# Patient Record
Sex: Female | Born: 1985 | Race: Black or African American | Hispanic: No | Marital: Single | Smoking: Never smoker
Health system: Southern US, Community
[De-identification: ages and names within clinical notes are randomized; demographics above are authoritative.]

## PROBLEM LIST (undated history)

## (undated) DIAGNOSIS — T7840XA Allergy, unspecified, initial encounter: Secondary | ICD-10-CM

## (undated) DIAGNOSIS — D649 Anemia, unspecified: Secondary | ICD-10-CM

## (undated) HISTORY — DX: Anemia, unspecified: D64.9

## (undated) HISTORY — PX: WISDOM TOOTH EXTRACTION: SHX21

## (undated) HISTORY — DX: Allergy, unspecified, initial encounter: T78.40XA

---

## 2004-05-05 ENCOUNTER — Emergency Department: Payer: Self-pay | Admitting: Emergency Medicine

## 2005-11-05 ENCOUNTER — Emergency Department: Payer: Self-pay | Admitting: Emergency Medicine

## 2006-05-15 ENCOUNTER — Emergency Department: Payer: Self-pay | Admitting: Internal Medicine

## 2008-02-28 ENCOUNTER — Emergency Department: Payer: Self-pay | Admitting: Emergency Medicine

## 2013-03-25 ENCOUNTER — Emergency Department: Payer: Self-pay | Admitting: Emergency Medicine

## 2013-11-26 ENCOUNTER — Emergency Department: Payer: Self-pay | Admitting: Emergency Medicine

## 2013-11-26 LAB — CBC
HCT: 37.9 % (ref 35.0–47.0)
HGB: 12.3 g/dL (ref 12.0–16.0)
MCH: 28.3 pg (ref 26.0–34.0)
MCHC: 32.3 g/dL (ref 32.0–36.0)
MCV: 88 fL (ref 80–100)
Platelet: 344 10*3/uL (ref 150–440)
RBC: 4.33 10*6/uL (ref 3.80–5.20)
RDW: 15.4 % — ABNORMAL HIGH (ref 11.5–14.5)
WBC: 10.1 10*3/uL (ref 3.6–11.0)

## 2013-11-26 LAB — WET PREP, GENITAL

## 2013-11-26 LAB — COMPREHENSIVE METABOLIC PANEL
ALBUMIN: 3 g/dL — AB (ref 3.4–5.0)
Alkaline Phosphatase: 91 U/L
Anion Gap: 7 (ref 7–16)
BILIRUBIN TOTAL: 0.3 mg/dL (ref 0.2–1.0)
BUN: 10 mg/dL (ref 7–18)
CHLORIDE: 106 mmol/L (ref 98–107)
Calcium, Total: 8.9 mg/dL (ref 8.5–10.1)
Co2: 24 mmol/L (ref 21–32)
Creatinine: 0.69 mg/dL (ref 0.60–1.30)
EGFR (Non-African Amer.): >60
GLUCOSE: 89 mg/dL (ref 65–99)
Osmolality: 272 (ref 275–301)
POTASSIUM: 3.6 mmol/L (ref 3.5–5.1)
SGOT(AST): 17 U/L (ref 15–37)
SGPT (ALT): 17 U/L
Sodium: 137 mmol/L (ref 136–145)
Total Protein: 9 g/dL — ABNORMAL HIGH (ref 6.4–8.2)

## 2013-11-26 LAB — LIPASE, BLOOD: Lipase: 129 U/L (ref 73–393)

## 2013-11-26 LAB — URINALYSIS, COMPLETE
BACTERIA: NONE SEEN
Bilirubin,UR: NEGATIVE
Blood: NEGATIVE
GLUCOSE, UR: NEGATIVE mg/dL (ref 0–75)
KETONE: NEGATIVE
Leukocyte Esterase: NEGATIVE
Nitrite: NEGATIVE
PH: 7 (ref 4.5–8.0)
Protein: NEGATIVE
RBC,UR: NONE SEEN /HPF (ref 0–5)
SPECIFIC GRAVITY: 1.012 (ref 1.003–1.030)
WBC UR: NONE SEEN /HPF (ref 0–5)

## 2013-11-26 LAB — HCG, QUANTITATIVE, PREGNANCY: Beta Hcg, Quant.: <1 m[IU]/mL — ABNORMAL LOW

## 2013-11-26 LAB — GC/CHLAMYDIA PROBE AMP

## 2015-10-10 ENCOUNTER — Emergency Department
Admission: EM | Admit: 2015-10-10 | Discharge: 2015-10-10 | Disposition: A | Discharge status: Home / Self Care | Payer: Self-pay | Attending: Emergency Medicine | Admitting: Emergency Medicine

## 2015-10-10 ENCOUNTER — Encounter: Payer: Self-pay | Admitting: Emergency Medicine

## 2015-10-10 DIAGNOSIS — J020 Streptococcal pharyngitis: Secondary | ICD-10-CM | POA: Insufficient documentation

## 2015-10-10 LAB — POCT RAPID STREP A: Streptococcus, Group A Screen (Direct): POSITIVE — AB

## 2015-10-10 MED ORDER — ACETAMINOPHEN-CODEINE #3 300-30 MG PO TABS
1.0000 | ORAL_TABLET | Freq: Once | ORAL | Status: AC
  Administered 2015-10-10: 1 via ORAL
  Filled 2015-10-10: qty 1

## 2015-10-10 MED ORDER — AZITHROMYCIN 500 MG PO TABS
500.0000 mg | ORAL_TABLET | Freq: Once | ORAL | Status: AC
  Administered 2015-10-10: 500 mg via ORAL
  Filled 2015-10-10: qty 1

## 2015-10-10 MED ORDER — AZITHROMYCIN 250 MG PO TABS: 250.0000 mg | ORAL_TABLET | Freq: Every day | ORAL | Status: AC

## 2015-10-10 MED ORDER — DEXAMETHASONE SODIUM PHOSPHATE 10 MG/ML IJ SOLN
10.0000 mg | Freq: Once | INTRAMUSCULAR | Status: AC
  Administered 2015-10-10: 10 mg via INTRAMUSCULAR
  Filled 2015-10-10: qty 1

## 2015-10-10 MED ORDER — ACETAMINOPHEN 325 MG PO TABS
ORAL_TABLET | ORAL | Status: DC
Start: 2015-10-10 — End: 2015-10-11
  Filled 2015-10-10: qty 2

## 2015-10-10 MED ORDER — ACETAMINOPHEN 325 MG PO TABS
650.0000 mg | ORAL_TABLET | Freq: Once | ORAL | Status: AC | PRN
  Administered 2015-10-10: 650 mg via ORAL

## 2015-10-10 MED ORDER — ACETAMINOPHEN-CODEINE #3 300-30 MG PO TABS: 1.0000 | ORAL_TABLET | Freq: Four times a day (QID) | ORAL | Status: DC | PRN

## 2015-10-10 NOTE — ED Notes (Signed)
Rapid Strep, Positive

## 2015-10-10 NOTE — ED Notes (Signed)
Patient woke up yesterday morning with fever and sore throat.

## 2015-10-10 NOTE — Discharge Instructions (Signed)
Strep Throat °Strep throat is an infection of the throat. It is caused by germs. Strep throat spreads from person to person because of coughing, sneezing, or close contact. °HOME CARE °Medicines  °· Take over-the-counter and prescription medicines only as told by your doctor. °· Take your antibiotic medicine as told by your doctor. Do not stop taking the medicine even if you feel better. °· Have family members who also have a sore throat or fever go to a doctor. °Eating and Drinking  °· Do not share food, drinking cups, or personal items. °· Try eating soft foods until your sore throat feels better. °· Drink enough fluid to keep your pee (urine) clear or pale yellow. °General Instructions °· Rinse your mouth (gargle) with a salt-water mixture 3-4 times per day or as needed. To make a salt-water mixture, stir ½-1 tsp of salt into 1 cup of warm water. °· Make sure that all people in your house wash their hands well. °· Rest. °· Stay home from school or work until you have been taking antibiotics for 24 hours. °· Keep all follow-up visits as told by your doctor. This is important. °GET HELP IF: °· Your neck keeps getting bigger. °· You get a rash, cough, or earache. °· You cough up thick liquid that is green, yellow-brown, or bloody. °· You have pain that does not get better with medicine. °· Your problems get worse instead of getting better. °· You have a fever. °GET HELP RIGHT AWAY IF: °· You throw up (vomit). °· You get a very bad headache. °· You neck hurts or it feels stiff. °· You have chest pain or you are short of breath. °· You have drooling, very bad throat pain, or changes in your voice. °· Your neck is swollen or the skin gets red and tender. °· Your mouth is dry or you are peeing less than normal. °· You keep feeling more tired or it is hard to wake up. °· Your joints are red or they hurt. °  °This information is not intended to replace advice given to you by your health care provider. Make sure you  discuss any questions you have with your health care provider. °  °Document Released: 10/04/2007 Document Revised: 01/06/2015 Document Reviewed: 08/10/2014 °Elsevier Interactive Patient Education ©2016 [employer] ° °

## 2015-10-11 NOTE — ED Provider Notes (Signed)
[facility] Emergency Department Provider Note ____________________________________________  Time seen: 2224  I have reviewed the triage vital signs and the nursing notes.  HISTORY  Chief Complaint  Sore Throat and Fever  HPI Anita Hodge is a 30 y.o. female presents to the ED for evaluation of sudden onset of sore throat and fevers 2 days prior. The patient denies any sick contacts or recent travel. She does report a history of previous strep infections. She has dosed Tylenol with limited benefit.She rates her pain as a 9/10 in triage.  History reviewed. No pertinent past medical history.  There are no active problems to display for this patient.  History reviewed. No pertinent past surgical history.  Current Outpatient Rx  Name  Route  Sig  Dispense  Refill  . acetaminophen-codeine (TYLENOL #3) 300-30 MG tablet   Oral   Take 1 tablet by mouth every 6 (six) hours as needed for moderate pain.   10 tablet   0   . azithromycin (ZITHROMAX) 250 MG tablet   Oral   Take 1 tablet (250 mg total) by mouth daily.   4 each   0    Allergies Penicillins  No family history on file.  Social History Social History  Substance Use Topics  . Smoking status: Never Smoker   . Smokeless tobacco: None  . Alcohol Use: Yes     Comment: occasionally   Review of Systems  Constitutional: Positive for fever. Eyes: Negative for visual changes. ENT: Positive for sore throat. Gastrointestinal: Negative for abdominal pain, vomiting and diarrhea. Skin: Negative for rash. Neurological: Negative for headaches, focal weakness or numbness. ____________________________________________  PHYSICAL EXAM:  VITAL SIGNS: ED Triage Vitals  Enc Vitals Group     BP 10/10/15 2109 111/54 mmHg     Pulse Rate 10/10/15 2109 120     Resp 10/10/15 2109 18     Temp 10/10/15 2109 101.3 F (38.5 C)     Temp Source 10/10/15 2109 Oral     SpO2 10/10/15 2109 96 %     Weight 10/10/15  2109 300 lb (136.079 kg)     Height 10/10/15 2109  (1.753 m)     Head Cir --      Peak Flow --      Pain Score 10/10/15 2110 9     Pain Loc --      Pain Edu? --      Excl. in GC? --    Constitutional: Alert and oriented. Well appearing and in no distress. Head: Normocephalic and atraumatic. Eyes: Conjunctivae are normal. PERRL. Normal extraocular movements Nose: No congestion/rhinorrhea. Mouth/Throat: Mucous membranes are moist. Uvula is midline and tonsils are enlarged, erythematous, and with purulent exudate. Neck: Supple. No thyromegaly. Hematological/Lymphatic/Immunological: Palpable cervical lymphadenopathy. Cardiovascular: Normal rate, regular rhythm.  Respiratory: Normal respiratory effort. No wheezes/rales/rhonchi. Skin:  Skin is warm, dry and intact. No rash noted. Psychiatric: Mood and affect are normal. Patient exhibits appropriate insight and judgment. ____________________________________________   LABS (pertinent positives/negatives) Labs Reviewed  POCT RAPID STREP A - Abnormal; Notable for the following:    Streptococcus, Group A Screen (Direct) POSITIVE (*)    All other components within normal limits  ____________________________________________  PROCEDURES  Tylenol 650 mg PO Decadron 10 mg PO Azithromycin 500 mg PO Tylenol #3 i PO ____________________________________________  INITIAL IMPRESSION / ASSESSMENT AND PLAN / ED COURSE  Patient with an extra acute strep tonsillitis confirmed by rapid strep test screen. She will be discharged with  a prescription for the balance of her azithromycin as well as prescription for Tylenol with Codeine to dose as needed for pain. She will follow-up with her primary care provider for ongoing symptom management. Return precautions are reviewed. ____________________________________________  FINAL CLINICAL IMPRESSION(S) / ED DIAGNOSES  Final diagnoses:  Strep throat     Lissa HoardJenise V Bacon Da Authement, PA-C 10/11/15  0015  Sharyn CreamerMark Quale, MD 10/12/15 0009

## 2017-05-26 ENCOUNTER — Emergency Department
Admission: EM | Admit: 2017-05-26 | Discharge: 2017-05-26 | Disposition: A | Discharge status: Home / Self Care | Payer: Self-pay | Attending: Emergency Medicine | Admitting: Emergency Medicine

## 2017-05-26 ENCOUNTER — Other Ambulatory Visit: Payer: Self-pay

## 2017-05-26 DIAGNOSIS — M5432 Sciatica, left side: Secondary | ICD-10-CM | POA: Insufficient documentation

## 2017-05-26 MED ORDER — OXYCODONE HCL 5 MG PO TABS
10.0000 mg | ORAL_TABLET | Freq: Once | ORAL | Status: AC
  Administered 2017-05-26: 10 mg via ORAL
  Filled 2017-05-26: qty 2

## 2017-05-26 MED ORDER — PREDNISONE 20 MG PO TABS
60.0000 mg | ORAL_TABLET | Freq: Once | ORAL | Status: AC
  Administered 2017-05-26: 60 mg via ORAL
  Filled 2017-05-26: qty 3

## 2017-05-26 MED ORDER — CYCLOBENZAPRINE HCL 10 MG PO TABS: 10.0000 mg | ORAL_TABLET | Freq: Three times a day (TID) | ORAL | 0 refills | Status: DC | PRN

## 2017-05-26 MED ORDER — HYDROCODONE-ACETAMINOPHEN 5-325 MG PO TABS: 1.0000 | ORAL_TABLET | ORAL | 0 refills | Status: AC | PRN

## 2017-05-26 MED ORDER — PREDNISONE 10 MG (21) PO TBPK: ORAL_TABLET | ORAL | 0 refills | Status: DC

## 2017-05-26 NOTE — ED Provider Notes (Signed)
[facility] Emergency Department Provider Note ____________________________________________  Time seen: Approximately 10:21 PM  I have reviewed the triage vital signs and the nursing notes.  HISTORY  Chief Complaint Back Pain   HPI Anita Hodge is a 32 y.o. female who presents to the emergency department for treatment and evaluation of left lower back pain that radiates down the left leg to the ankle.  Patient states that she has had intermittent back pain on and off for several months and it is never been this bad.  She states that she has been able to take some ibuprofen then take a hot shower and rest which has helped in the past but for the past day she has had no relief with any of the above.  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  She denies any injury.  She denies dysuria or hematuria. No past medical history on file.  There are no active problems to display for this patient.   No past surgical history on file.  Prior to Admission medications   Medication Sig Start Date End Date Taking? Authorizing Provider  acetaminophen-codeine (TYLENOL #3) 300-30 MG tablet Take 1 tablet by mouth every 6 (six) hours as needed for moderate pain. 10/10/15   Menshew, Charlesetta Ivory, PA-C  cyclobenzaprine (FLEXERIL) 10 MG tablet Take 1 tablet (10 mg total) by mouth 3 (three) times daily as needed for muscle spasms. 05/26/17   Khali Albanese, Rulon Eisenmenger B, FNP  HYDROcodone-acetaminophen (NORCO/VICODIN) 5-325 MG tablet Take 1 tablet by mouth every 4 (four) hours as needed for moderate pain. 05/26/17 05/26/18  Boyd Litaker, Kasandra Knudsen, FNP  predniSONE (STERAPRED UNI-PAK 21 TAB) 10 MG (21) TBPK tablet Take 6 tablets on day 1 Take 5 tablets on day 2 Take 4 tablets on day 3 Take 3 tablets on day 4 Take 2 tablets on day 5 Take 1 tablet on day 6 05/26/17   Kem Boroughs B, FNP    Allergies Penicillins  No family history on  file.  Social History Social History   Tobacco Use  . Smoking status: Never Smoker  Substance Use Topics  . Alcohol use: Yes    Comment: occasionally  . Drug use: No    Review of Systems Constitutional: Well appearing. Respiratory: Negative for dyspnea. Cardiovascular: Negative for change in skin temperature or color. Musculoskeletal:   Negative for chronic steroid use   Negative for trauma in the presence of osteoporosis  Negative for age over 52 and trauma.  Negative for constitutional symptoms, or history of cancer   Negative for pain worse at night. Skin: Negative for rash, lesion, or wound.  Genitourinary: Negative for urinary retention. Rectal: Negative for fecal incontinence or new onset constipation/bowel habit changes. Hematological/Immunilogical: Negative for immunosuppression, IV drug use, or fever Neurological: Positive for burning, tingling, numb, electric, radiating pain in the left lower extremity                        Negative for saddle anesthesia.                        Negative for focal neurologic deficit, progressive or disabling symptoms             Negative for saddle anesthesia. ____________________________________________   PHYSICAL EXAM:  VITAL SIGNS: ED Triage Vitals [05/26/17 2130]  Enc Vitals Group  BP 121/73     Pulse Rate 73     Resp 16     Temp 98.3 F (36.8 C)     Temp Source Oral     SpO2 100 %     Weight (!) 320 lb (145.2 kg)     Height 5\' 10"  (1.778 m)     Head Circumference      Peak Flow      Pain Score 9     Pain Loc      Pain Edu?      Excl. in GC?     Constitutional: Alert and oriented. Well appearing and in no acute distress. Eyes: Conjunctivae are clear without discharge or drainage.  Head: Atraumatic. Neck: Full, active range of motion. Respiratory: Respirations even and unlabored. Musculoskeletal: Limited ROM of the back and left lower extremity secondary to pain, Strength 5/5 of the lower extremities as  tested. Neurologic: Reflexes of the lower extremities are 2+.  Positive straight leg raise on the left side. Skin: Atraumatic.  Psychiatric: Behavior and affect are normal.  ____________________________________________   LABS (all labs ordered are listed, but only abnormal results are displayed)  Labs Reviewed - No data to display ____________________________________________  RADIOLOGY  Not indicated ____________________________________________   PROCEDURES  Procedure(s) performed:  Procedures ____________________________________________   INITIAL IMPRESSION / ASSESSMENT AND PLAN / ED COURSE  Murtis Sinkia V Ortlieb is a 32 y.o. female who presents to the emergency department for evaluation treatment of symptoms and exam most consistent with sciatica.  She was instructed to take her medications as prescribed and to follow up with PCP or orthopedics if not improving over the week. She was advised to return to the emergency department for symptoms that change or worsen if unable to schedule an appointment.  Medications  oxyCODONE (Oxy IR/ROXICODONE) immediate release tablet 10 mg (not administered)  predniSONE (DELTASONE) tablet 60 mg (not administered)    ED Discharge Orders        Ordered    HYDROcodone-acetaminophen (NORCO/VICODIN) 5-325 MG tablet  Every 4 hours PRN     05/26/17 2211    predniSONE (STERAPRED UNI-PAK 21 TAB) 10 MG (21) TBPK tablet     05/26/17 2211    cyclobenzaprine (FLEXERIL) 10 MG tablet  3 times daily PRN     05/26/17 2211       Pertinent labs & imaging results that were available during my care of the patient were reviewed by me and considered in my medical decision making (see chart for details).  _________________________________________   FINAL CLINICAL IMPRESSION(S) / ED DIAGNOSES  Final diagnoses:  Sciatica of left side     If controlled substance prescribed during this visit, 12 month history viewed on the [facility] prior to issuing an  initial prescription for Schedule II or III opiod.    Anita Pesterriplett, Leyna Vanderkolk B, FNP 05/26/17 2228    Sharman CheekStafford, Phillip, MD 05/27/17 2329

## 2017-05-26 NOTE — ED Notes (Signed)
NAD noted at time of D/C. Pt denies questions or concerns. Pt taken to the lobby via wheelchair at this time.  

## 2017-05-26 NOTE — ED Triage Notes (Signed)
Pt states low back pain that radiates down left lower leg to ankle. Pt states pain is worse with sitting, ambulation.

## 2018-10-30 ENCOUNTER — Ambulatory Visit (LOCAL_COMMUNITY_HEALTH_CENTER): Payer: Medicaid Other | Admitting: Physician Assistant

## 2018-10-30 ENCOUNTER — Other Ambulatory Visit: Payer: Self-pay

## 2018-10-30 VITALS — BP 118/72

## 2018-10-30 DIAGNOSIS — Z3009 Encounter for other general counseling and advice on contraception: Secondary | ICD-10-CM

## 2018-10-30 DIAGNOSIS — Z30013 Encounter for initial prescription of injectable contraceptive: Secondary | ICD-10-CM | POA: Diagnosis not present

## 2018-10-30 MED ORDER — MEDROXYPROGESTERONE ACETATE 150 MG/ML IM SUSP
150.0000 mg | Freq: Once | INTRAMUSCULAR | Status: AC
  Administered 2018-10-30: 12:00:00 150 mg via INTRAMUSCULAR

## 2018-10-30 NOTE — Progress Notes (Signed)
Family Planning Visit- Repeat Yearly Visit  Subjective:  Anita Hodge is a 33 y.o. being seen today  to discuss family planning options.    She is currently using condoms most of the time for pregnancy prevention. Patient reports she does not  Want a pregnancy in the next year. Patient has the following medical conditionsdoes not have a problem list on file.  Chief Complaint  Patient presents with  . Contraception    Patient reports that she has been on Depo in the past without any problem.  Last Depo was prior to last pregnancy, Delivered 08/23/2016.  Last pap was 06/2017 and was normal.  LMP 10/04/2018 and was normal.  Last sex was 10/29/2018 with a condom.  Has been using condoms always and preformed a OTC pregnancy test at home on 10/26/2018 which was negative.  Declines repeat pregnancy test today.  Patient denies any concerns today.  Does the patient desire a pregnancy in the next year? (OKQ flowsheet)  See flowsheet for other program required questions.   There is no height or weight on file to calculate BMI. - Patient is eligible for diabetes screening based on BMI and age >67?  not applicable EL3Y ordered? not applicable  Patient reports 1 of partners in last year. Desires STI screening?  No - .  Does the patient have a current or past history of drug use? No   No components found for: HCV]   Health Maintenance Due  Topic Date Due  . HIV Screening  12/09/1999  . TETANUS/TDAP  12/09/2003  . PAP SMEAR-Modifier  12/08/2005    Review of Systems  All other systems reviewed and are negative.   The following portions of the patient's history were reviewed and updated as appropriate: allergies, current medications, past family history, past medical history, past social history, past surgical history and problem list. Problem list updated.  Objective:   Vitals:   10/30/18 1100  BP: 118/72    Physical Exam Constitutional:      Appearance: She is obese.  Neurological:    Mental Status: She is alert and oriented to person, place, and time.  Psychiatric:        Mood and Affect: Mood normal.    Due to COVID-19, we are not doing any physical exams at this time.   Assessment and Plan:  Anita Hodge is a 33 y.o. female presenting to the [facility] Department for a family planning visit  Contraception counseling: Reviewed all forms of birth control options available including abstinence; over the counter/barrier methods; hormonal contraceptive medication including pill, patch, ring, injection,contraceptive implant; hormonal and nonhormonal IUDs; permanent sterilization options including vasectomy and the various tubal sterilization modalities. Risks and benefits reviewed.  Questions were answered.  Written information was also given to the patient to review.  Patient desires Depo, this was prescribed for patient. She will follow up in  3 months for surveillance.  She was told to call with any further questions, or with any concerns about this method of contraception.  Emphasized use of condoms 100% of the time for STI prevention.  1. Contraceptive education Reviewed with patient all BCMs; risks, benefits, and SE. Rec condoms with all sex for STD protection and especially for 2 weeks after shot today. RTC for RP appt in ~12 weeks if we are doing PE at that time or for Depo only if not. - medroxyPROGESTERone (DEPO-PROVERA) injection 150 mg  2. Initiation of Depo Provera OK for Depo 150mg  IM  q 11-13 weeks x 2, starting today.  Per patient request, please give in hip today Counseled patient when to call for irregular bleeding  - medroxyPROGESTERone (DEPO-PROVERA) injection 150 mg     No follow-ups on file.  No future appointments.  Matt Holmesarla J Ramina Hulet, PA

## 2018-10-31 ENCOUNTER — Encounter: Payer: Self-pay | Admitting: Physician Assistant

## 2020-08-04 ENCOUNTER — Ambulatory Visit: Payer: Medicaid Other

## 2021-06-14 ENCOUNTER — Emergency Department: Payer: Self-pay

## 2021-06-14 ENCOUNTER — Observation Stay
Admission: EM | Admit: 2021-06-14 | Discharge: 2021-06-15 | Disposition: A | Discharge status: Home / Self Care | Payer: Self-pay | Attending: Internal Medicine | Admitting: Internal Medicine

## 2021-06-14 ENCOUNTER — Encounter: Payer: Self-pay | Admitting: Emergency Medicine

## 2021-06-14 ENCOUNTER — Observation Stay: Payer: Self-pay

## 2021-06-14 DIAGNOSIS — G43809 Other migraine, not intractable, without status migrainosus: Secondary | ICD-10-CM

## 2021-06-14 DIAGNOSIS — M7521 Bicipital tendinitis, right shoulder: Principal | ICD-10-CM | POA: Insufficient documentation

## 2021-06-14 DIAGNOSIS — M25511 Pain in right shoulder: Secondary | ICD-10-CM

## 2021-06-14 DIAGNOSIS — M542 Cervicalgia: Secondary | ICD-10-CM

## 2021-06-14 DIAGNOSIS — M778 Other enthesopathies, not elsewhere classified: Secondary | ICD-10-CM | POA: Insufficient documentation

## 2021-06-14 DIAGNOSIS — G43909 Migraine, unspecified, not intractable, without status migrainosus: Secondary | ICD-10-CM

## 2021-06-14 DIAGNOSIS — D649 Anemia, unspecified: Secondary | ICD-10-CM | POA: Insufficient documentation

## 2021-06-14 DIAGNOSIS — G459 Transient cerebral ischemic attack, unspecified: Secondary | ICD-10-CM | POA: Insufficient documentation

## 2021-06-14 DIAGNOSIS — R200 Anesthesia of skin: Secondary | ICD-10-CM

## 2021-06-14 DIAGNOSIS — U071 COVID-19: Secondary | ICD-10-CM | POA: Insufficient documentation

## 2021-06-14 DIAGNOSIS — Z8669 Personal history of other diseases of the nervous system and sense organs: Secondary | ICD-10-CM

## 2021-06-14 LAB — LIPID PANEL
Cholesterol: 174 mg/dL (ref 0–200)
HDL: 45 mg/dL (ref >40)
LDL Cholesterol: 111 mg/dL — ABNORMAL HIGH (ref 0–99)
Total CHOL/HDL Ratio: 3.9 RATIO
Triglycerides: 90 mg/dL (ref <150)
VLDL: 18 mg/dL (ref 0–40)

## 2021-06-14 LAB — COMPREHENSIVE METABOLIC PANEL
ALT: 13 U/L (ref 0–44)
AST: 16 U/L (ref 15–41)
Albumin: 3.4 g/dL — ABNORMAL LOW (ref 3.5–5.0)
Alkaline Phosphatase: 67 U/L (ref 38–126)
Anion gap: 5 (ref 5–15)
BUN: 18 mg/dL (ref 6–20)
CO2: 27 mmol/L (ref 22–32)
Calcium: 8.9 mg/dL (ref 8.9–10.3)
Chloride: 104 mmol/L (ref 98–111)
Creatinine, Ser: 0.72 mg/dL (ref 0.44–1.00)
GFR, Estimated: >60 mL/min (ref >60)
Glucose, Bld: 92 mg/dL (ref 70–99)
Potassium: 3.7 mmol/L (ref 3.5–5.1)
Sodium: 136 mmol/L (ref 135–145)
Total Bilirubin: 0.3 mg/dL (ref 0.3–1.2)
Total Protein: 8.3 g/dL — ABNORMAL HIGH (ref 6.5–8.1)

## 2021-06-14 LAB — CBC
HCT: 31.3 % — ABNORMAL LOW (ref 36.0–46.0)
Hemoglobin: 9.3 g/dL — ABNORMAL LOW (ref 12.0–15.0)
MCH: 23.4 pg — ABNORMAL LOW (ref 26.0–34.0)
MCHC: 29.7 g/dL — ABNORMAL LOW (ref 30.0–36.0)
MCV: 78.8 fL — ABNORMAL LOW (ref 80.0–100.0)
Platelets: 368 10*3/uL (ref 150–400)
RBC: 3.97 MIL/uL (ref 3.87–5.11)
RDW: 17.7 % — ABNORMAL HIGH (ref 11.5–15.5)
WBC: 6.8 10*3/uL (ref 4.0–10.5)
nRBC: 0 % (ref 0.0–0.2)

## 2021-06-14 LAB — RESP PANEL BY RT-PCR (FLU A&B, COVID) ARPGX2
Influenza A by PCR: NEGATIVE
Influenza B by PCR: NEGATIVE
SARS Coronavirus 2 by RT PCR: POSITIVE — AB

## 2021-06-14 LAB — DIFFERENTIAL
Abs Immature Granulocytes: 0.01 10*3/uL (ref 0.00–0.07)
Basophils Absolute: 0 10*3/uL (ref 0.0–0.1)
Basophils Relative: 0 %
Eosinophils Absolute: 0.2 10*3/uL (ref 0.0–0.5)
Eosinophils Relative: 3 %
Immature Granulocytes: 0 %
Lymphocytes Relative: 34 %
Lymphs Abs: 2.3 10*3/uL (ref 0.7–4.0)
Monocytes Absolute: 1.1 10*3/uL — ABNORMAL HIGH (ref 0.1–1.0)
Monocytes Relative: 17 %
Neutro Abs: 3.2 10*3/uL (ref 1.7–7.7)
Neutrophils Relative %: 46 %

## 2021-06-14 LAB — HEMOGLOBIN A1C
Hgb A1c MFr Bld: 5.3 % (ref 4.8–5.6)
Mean Plasma Glucose: 105.41 mg/dL

## 2021-06-14 LAB — TROPONIN I (HIGH SENSITIVITY): Troponin I (High Sensitivity): 3 ng/L (ref <18)

## 2021-06-14 LAB — APTT: aPTT: 23 seconds — ABNORMAL LOW (ref 24–36)

## 2021-06-14 LAB — PROTIME-INR
INR: 1.1 (ref 0.8–1.2)
Prothrombin Time: 13.7 seconds (ref 11.4–15.2)

## 2021-06-14 MED ORDER — KETOROLAC TROMETHAMINE 30 MG/ML IJ SOLN
30.0000 mg | Freq: Once | INTRAMUSCULAR | Status: AC
  Administered 2021-06-14: 30 mg via INTRAVENOUS
  Filled 2021-06-14: qty 1

## 2021-06-14 MED ORDER — ACETAMINOPHEN 650 MG RE SUPP: 650.0000 mg | RECTAL | Status: DC | PRN

## 2021-06-14 MED ORDER — ONDANSETRON HCL 4 MG/2ML IJ SOLN: 4.0000 mg | INTRAMUSCULAR | Status: DC | PRN

## 2021-06-14 MED ORDER — ASPIRIN 81 MG PO CHEW
324.0000 mg | CHEWABLE_TABLET | Freq: Once | ORAL | Status: AC
  Administered 2021-06-14: 324 mg via ORAL
  Filled 2021-06-14: qty 4

## 2021-06-14 MED ORDER — SODIUM CHLORIDE 0.9% FLUSH: 3.0000 mL | Freq: Once | INTRAVENOUS | Status: DC

## 2021-06-14 MED ORDER — CLOPIDOGREL BISULFATE 75 MG PO TABS
75.0000 mg | ORAL_TABLET | Freq: Every day | ORAL | Status: DC
  Administered 2021-06-14 – 2021-06-15 (×2): 75 mg via ORAL
  Filled 2021-06-14 (×2): qty 1

## 2021-06-14 MED ORDER — DIPHENHYDRAMINE HCL 50 MG/ML IJ SOLN
25.0000 mg | Freq: Once | INTRAMUSCULAR | Status: AC
Start: 2021-06-14 — End: 2021-06-14
  Administered 2021-06-14: 25 mg via INTRAVENOUS
  Filled 2021-06-14: qty 1

## 2021-06-14 MED ORDER — ACETAMINOPHEN 325 MG PO TABS: 650.0000 mg | ORAL_TABLET | ORAL | Status: DC | PRN

## 2021-06-14 MED ORDER — ASPIRIN EC 81 MG PO TBEC
81.0000 mg | DELAYED_RELEASE_TABLET | Freq: Every day | ORAL | Status: DC
  Administered 2021-06-15: 10:00:00 81 mg via ORAL
  Filled 2021-06-14: qty 1

## 2021-06-14 MED ORDER — ACETAMINOPHEN 160 MG/5ML PO SOLN
650.0000 mg | ORAL | Status: DC | PRN
  Filled 2021-06-14: qty 20.3

## 2021-06-14 MED ORDER — SODIUM CHLORIDE 0.9 % IV SOLN: INTRAVENOUS | Status: DC

## 2021-06-14 MED ORDER — PROCHLORPERAZINE EDISYLATE 10 MG/2ML IJ SOLN
10.0000 mg | Freq: Once | INTRAMUSCULAR | Status: AC
  Administered 2021-06-14: 10 mg via INTRAVENOUS
  Filled 2021-06-14: qty 2

## 2021-06-14 MED ORDER — STROKE: EARLY STAGES OF RECOVERY BOOK
Freq: Once | Status: AC
  Administered 2021-06-15: 02:00:00 1

## 2021-06-14 MED ORDER — ENOXAPARIN SODIUM 80 MG/0.8ML IJ SOSY
0.5000 mg/kg | PREFILLED_SYRINGE | INTRAMUSCULAR | Status: DC
  Administered 2021-06-15: 02:00:00 72.5 mg via SUBCUTANEOUS
  Filled 2021-06-14 (×3): qty 0.72

## 2021-06-14 MED ORDER — MAGNESIUM HYDROXIDE 400 MG/5ML PO SUSP: 30.0000 mL | Freq: Every day | ORAL | Status: DC | PRN

## 2021-06-14 MED ORDER — SENNOSIDES-DOCUSATE SODIUM 8.6-50 MG PO TABS: 1.0000 | ORAL_TABLET | Freq: Every evening | ORAL | Status: DC | PRN

## 2021-06-14 MED ORDER — TRAZODONE HCL 50 MG PO TABS: 25.0000 mg | ORAL_TABLET | Freq: Every evening | ORAL | Status: DC | PRN

## 2021-06-14 NOTE — ED Notes (Addendum)
Pt transported to CT with neuro continued at bedside. Tele neuro activated

## 2021-06-14 NOTE — ED Triage Notes (Signed)
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 Pt reported right hand grip weakness and HA at 1835. On arrival symptoms still present. Neurologist at stretcher as well as CBG obtained by ED staff. No obvious facial droop, no slurred speech. Weakness noted with right handgrip and pt reports HA but denies vision changes. No previous medical hx. Pt taken from EMS stretcher to CT with RN and neurologist.   CBG on arrival 79  167/114 89 HR 97% O2 RA 20 RR

## 2021-06-14 NOTE — ED Provider Notes (Signed)
[facility] Provider Note    Event Date/Time   First MD Initiated Contact with Patient 06/14/21 1948     (approximate)   History   Extremity Weakness   HPI  Anita Hodge is a 36 y.o. female with no known medical problems who comes ED complaining of right hand weakness that started at 6:30 PM.  Also reports some right shoulder pain that radiates down the arm.  No trauma, no other areas of weakness or altered sensation.     Physical Exam   Triage Vital Signs: ED Triage Vitals  Enc Vitals Group     BP 06/14/21 1950 (!) 167/114     Pulse Rate 06/14/21 1950 89     Resp 06/14/21 1950 (!) 21     Temp 06/14/21 2033 98.4 F (36.9 C)     Temp Source 06/14/21 2033 Oral     SpO2 06/14/21 1950 97 %     Weight 06/14/21 2033 (!) 315 lb (142.9 kg)     Height 06/14/21 2033 5\' 9"  (1.753 m)     Head Circumference --      Peak Flow --      Pain Score --      Pain Loc --      Pain Edu? --      Excl. in GC? --     Most recent vital signs: Vitals:   06/14/21 2030 06/14/21 2033  BP: 113/72   Pulse: 95   Resp: 18   Temp:  98.4 F (36.9 C)  SpO2: 100%      General: Awake, no distress.  CV:  Good peripheral perfusion.  Resp:  Normal effort.  Abd:  No distention.  Other:  Right hand weakness.  NIH stroke scale 1   ED Results / Procedures / Treatments   Labs (all labs ordered are listed, but only abnormal results are displayed) Labs Reviewed  APTT - Abnormal; Notable for the following components:      Result Value   aPTT 23 (*)    All other components within normal limits  CBC - Abnormal; Notable for the following components:   Hemoglobin 9.3 (*)    HCT 31.3 (*)    MCV 78.8 (*)    MCH 23.4 (*)    MCHC 29.7 (*)    RDW 17.7 (*)    All other components within normal limits  DIFFERENTIAL - Abnormal; Notable for the following components:   Monocytes Absolute 1.1 (*)    All other components within normal limits  COMPREHENSIVE METABOLIC PANEL -  Abnormal; Notable for the following components:   Total Protein 8.3 (*)    Albumin 3.4 (*)    All other components within normal limits  RESP PANEL BY RT-PCR (FLU A&B, COVID) ARPGX2  PROTIME-INR  HEMOGLOBIN A1C  LIPID PANEL  CBG MONITORING, ED  POC URINE PREG, ED  TROPONIN I (HIGH SENSITIVITY)     EKG  Interpreted by me Normal sinus rhythm rate of 86.  Normal axis and intervals.  Normal QRS ST segments and T waves.   RADIOLOGY CT head viewed and interpreted by me, negative for mass or bleeding.  Radiology report reviewed.  Discussed with radiologist.    PROCEDURES:  Critical Care performed: No  Procedures   MEDICATIONS ORDERED IN ED: Medications  aspirin chewable tablet 324 mg (324 mg Oral Given 06/14/21 2010)  diphenhydrAMINE (BENADRYL) injection 25 mg (25 mg Intravenous Given 06/14/21 2021)  ketorolac (TORADOL) 30 MG/ML injection 30  mg (30 mg Intravenous Given 06/14/21 2026)  prochlorperazine (COMPAZINE) injection 10 mg (10 mg Intravenous Given 06/14/21 2034)     IMPRESSION / MDM / ASSESSMENT AND PLAN / ED COURSE  I reviewed the triage vital signs and the nursing notes.                              Differential diagnosis includes, but is not limited to, ischemic stroke, intracranial tumor, intracranial hemorrhage, radiculopathy, complicated migraine, carpal tunnel     Clinical Course as of 06/14/21 2110  Tue Jun 14, 2021  1959 CT head viewed and interpreted by me, appears normal, negative for ICH or mass.  Discussed with radiology Dr. Chase Picket who confirms normal appearance. [PS]  2017 Discussed with neurology Dr. Thomasena Edis who suspects complicated migraine.  Would not treat with TNK.  Plan to treat with migraine medications and also pursue Anita Hodge work-up.  I will discuss with hospitalist. [PS]    Clinical Course User Index [PS] Sharman Cheek, MD     FINAL CLINICAL IMPRESSION(S) / ED DIAGNOSES   Final diagnoses:  Ellison (transient ischemic attack)     Rx /  DC Orders   ED Discharge Orders     None        Note:  This document was prepared using Dragon voice recognition software and may include unintentional dictation errors.   Sharman Cheek, MD 06/14/21 2115

## 2021-06-14 NOTE — Consult Note (Signed)
Neurology Stroke Consult H&P  Anita Hodge MR# [id number] 06/14/2021  CC: right upper extremity numbness  History is obtained from: patient and chart.  HPI: Anita Hodge is a 36 y.o. 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 The numbness is now back up in her shoulder with associated shooting pain down her arm.  The sensory disturbance started as a numbness in her right shoulder which migrated distally in into her hand. The numbness evolved into a sharp shooting pain in the anterior shoulder shooting down anterior aspect of her right arm.  She had associated headache earlier today which was unilateral and pulsating with associated nausea, photophobia. She became light headed and had to sit down. Currently pain ~8/10.   LKW: 1835 tNK given: No too mild IR Thrombectomy No, low NIHSS Modified Rankin Scale: 0-Completely asymptomatic and back to baseline post- stroke NIHSS: 1  ROS: A complete ROS was performed and is negative except as noted in the HPI.  History reviewed. No pertinent past medical history.  Family History  Problem Relation Age of Onset   Liver cancer Father    Social History:  reports that she has never smoked. She has never used smokeless tobacco. She reports current alcohol use. She reports that she does not use drugs.   Prior to Admission medications   Medication Sig Start Date End Date Taking? Authorizing Provider  acetaminophen-codeine (TYLENOL #3) 300-30 MG tablet Take 1 tablet by mouth every 6 (six) hours as needed for moderate pain. Patient not taking: Reported on 06/14/2021 10/10/15   Menshew, Charlesetta Ivory, PA-C  cyclobenzaprine (FLEXERIL) 10 MG tablet Take 1 tablet (10 mg total) by mouth 3 (three) times daily as needed for muscle spasms. Patient not taking: Reported on 06/14/2021 05/26/17   Kem Boroughs B, FNP   predniSONE (STERAPRED UNI-PAK 21 TAB) 10 MG (21) TBPK tablet Take 6 tablets on day 1 Take 5 tablets on day 2 Take 4 tablets on day 3 Take 3 tablets on day 4 Take 2 tablets on day 5 Take 1 tablet on day 6 Patient not taking: Reported on 06/14/2021 05/26/17   Chinita Pester, FNP    Exam: Current vital signs: BP (!) 167/114 (BP Location: Left Arm)    Pulse 89    Resp (!) 21    SpO2 97%   Physical Exam  Constitutional: Appears well-developed and well-nourished.  Psych: Affect appropriate to situation Eyes: No scleral injection HENT: No OP obstruction. Head: Normocephalic.  Cardiovascular: Normal rate and regular rhythm.  Respiratory: Effort normal, symmetric excursions bilaterally, no audible wheezing. GI: Soft.  No distension. There is no tenderness.  Skin: WDI  Neuro: Mental Status: Patient is awake, alert, oriented to person, place, month, year, and situation. Patient is able to give a clear and coherent history. Speech fluent, intact comprehension and repetition. No signs of aphasia or neglect. Visual Fields are full. Pupils are equal, round, and reactive to light. EOMI without ptosis or diplopia.  Facial sensation is symmetric to temperature Facial movement is symmetric.  Hearing is intact to voice. Uvula midline and palate elevates symmetrically. Shoulder shrug is symmetric. Tongue is midline without atrophy or fasciculations.  Tone is normal. Bulk is normal. 4/5 strength in right upper extremity proximal to distal. Point tenderness at the subscapularis tendon radiating around deltoid into subscapularis region. Sensation is symmetric temperature in the arms  and legs. Deep Tendon Reflexes: 2+ and symmetric in the biceps and patellae. Toes are downgoing bilaterally. FNF and HKS are intact but somewhat limited by pain in the right. Gait - Deferred  I have reviewed labs in epic and the pertinent results are: None available at time of evaluation  I have reviewed the  images obtained: NCT head showed normal head CT, ASPECTS is 10.  Assessment: Anita Hodge is a 36 y.o. female PMHx remote migraine with acute right arm numbness starting at shoulder and spreading to hand which evolved into sharp shooting pain along the anterior arm to hand.   CT did not show ischemic changes and as the symptoms were non disabling she was not a candidate for tNK and was given aspirin 324mg  now dose.  With her prior history of migraine and her associated headache with migrainous characteristics, she was given migraine cocktail.  Though she does not have vascular risk factors, her migrains have been in remission and she will need admission for further Estalene workup.   Plan: - MRI brain without contrast - Ordered. - MRA head and neck - Ordered. - MRI cervical spine - Ordered. - Recommend TTE. - Recommend labs: HbA1c, lipid panel - Ordered. - Recommend Statin if LDL > 70 - Continue aspirin 81mg  daily. - SBP goal <160. - Telemetry monitoring for arrhythmia. - Recommend bedside Swallow screen. - Recommend Stroke education. - Recommend PT/OT/SLP consult.  This patient is critically ill and at significant risk of neurological worsening, death and care requires constant monitoring of vital signs, hemodynamics,respiratory and cardiac monitoring, neurological assessment, discussion with family, other specialists and medical decision making of high complexity. I spent 75 minutes of neurocritical care time  in the care of  this patient. This was time spent independent of any time provided by nurse practitioner or PA.  Electronically signed by:  , MD Page: 06/14/2021, 8:06 PM  If 7pm- 7am, please page neurology on call as listed in AMION.

## 2021-06-14 NOTE — ED Notes (Signed)
Patient transported to MRI 

## 2021-06-14 NOTE — ED Notes (Signed)
Pt arrives to ED. Neurologist present at bedside on arrival

## 2021-06-14 NOTE — H&P (Addendum)
[place]   PATIENT NAME: Anita Hodge    MR#:  [id number]  DATE OF BIRTH:  Aug 17, 1985  DATE OF ADMISSION:  06/14/2021  PRIMARY CARE PHYSICIAN: Patient, No Pcp Per (Inactive)   Patient is coming from: Home  REQUESTING/REFERRING PHYSICIAN: Brenton Grills, MD  CHIEF COMPLAINT:   Chief Complaint  Patient presents with   Extremity Weakness    HISTORY OF PRESENT ILLNESS:  Anita Hodge is a 36 y.o. 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  She admitted to headache and did not feel good.  She went to the office and sat down.  She denied any lower extremity numbness or weakness.  No tinnitus or vertigo.  She denied any nausea or vomiting or abdominal pain.  No dysuria, oliguria or hematuria or flank pain.  No witnessed seizures.  No chest pain or palpitations.  She had back pain earlier this week with sciatica.  No fever or chills.  No neck pain or stiffness.  ED Course: When she came to the ER BP was 167/114 and later 130/72. Labs revealed unremarkable CMP except for albumin 3.4 and CBC showed anemia comparable to 2018.  Influenza antigens came back negative and COVID-19 PCR came back positive, and influenza antigens negative  EKG as reviewed by me : EKG showed sinus rhythm with rate of 86 Imaging: Noncontrast head CT scan revealed no acute intracranial normalities.  The patient had an MRI of the brain as well as head and neck MRA which all can back negative. C-spine MRI came back negative.  She was seen by Dr. Theda Sers in the ER.  She was given 30 mg of IV Toradol, 4 baby aspirin, 25 mg of IV Benadryl and 10 mg of IV Compazine.  She will be admitted to an observation medical telemetry bed for further evaluation and management. PAST MEDICAL HISTORY:    Migraine  PAST SURGICAL HISTORY:   Past Surgical History:  Procedure Laterality  Date   CESAREAN SECTION    Asked to  SOCIAL HISTORY:   Social History   Tobacco Use   Smoking status: Never   Smokeless tobacco: Never  Substance Use Topics   Alcohol use: Yes    Comment: occasionally  She drinks alcohol socially.  FAMILY HISTORY:   Family History  Problem Relation Age of Onset   Liver cancer Father     DRUG ALLERGIES:   Allergies  Allergen Reactions   Penicillins     Unknown reaction "since birth"    REVIEW OF SYSTEMS:   ROS As per history of present illness. All pertinent systems were reviewed above. Constitutional, HEENT, cardiovascular, respiratory, GI, GU, musculoskeletal, neuro, psychiatric, endocrine, integumentary and hematologic systems were reviewed and are otherwise negative/unremarkable except for positive findings mentioned above in the HPI.   MEDICATIONS AT HOME:   Prior to Admission medications   Medication Sig Start Date End Date Taking? Authorizing Provider  acetaminophen-codeine (TYLENOL #3) 300-30 MG tablet Take 1 tablet by mouth every 6 (six) hours as needed for moderate pain. Patient not taking: Reported on 06/14/2021 10/10/15   Menshew, Dannielle Karvonen, PA-C  cyclobenzaprine (FLEXERIL) 10 MG tablet Take 1 tablet (10 mg total) by mouth 3 (three) times daily as needed for muscle spasms. Patient not taking: Reported on 06/14/2021 05/26/17   Sherrie George B, FNP  predniSONE (STERAPRED UNI-PAK 21 TAB) 10  MG (21) TBPK tablet Take 6 tablets on day 1 Take 5 tablets on day 2 Take 4 tablets on day 3 Take 3 tablets on day 4 Take 2 tablets on day 5 Take 1 tablet on day 6 Patient not taking: Reported on 06/14/2021 05/26/17   Sherrie George B, FNP      VITAL SIGNS:  Blood pressure 113/72, pulse 95, temperature 98.4 F (36.9 C), temperature source Oral, resp. rate 18, height 5\' 9"  (1.753 m), weight (!) 142.9 kg, SpO2 100 %.  PHYSICAL EXAMINATION:  Physical Exam  GENERAL:  36 y.o.-year-old obese African-American female patient lying in  the bed with no acute distress.  EYES: Pupils equal, round, reactive to light and accommodation. No scleral icterus. Extraocular muscles intact.  HEENT: Head atraumatic, normocephalic. Oropharynx and nasopharynx clear.  NECK:  Supple, no jugular venous distention. No thyroid enlargement, no tenderness.  LUNGS: Normal breath sounds bilaterally, no wheezing, rales,rhonchi or crepitation. No use of accessory muscles of respiration.  CARDIOVASCULAR: Regular rate and rhythm, S1, S2 normal. No murmurs, rubs, or gallops.  ABDOMEN: Soft, nondistended, nontender. Bowel sounds present. No organomegaly or mass.  EXTREMITIES: No pedal edema, cyanosis, or clubbing.  NEUROLOGIC: Cranial nerves II through XII are intact. Muscle strength 5/5 in all extremities. Sensation intact. Gait not checked.  PSYCHIATRIC: The patient is alert and oriented x 3.  Normal affect and good eye contact. SKIN: No obvious rash, lesion, or ulcer.   LABORATORY PANEL:   CBC Recent Labs  Lab 06/14/21 2008  WBC 6.8  HGB 9.3*  HCT 31.3*  PLT 368   ------------------------------------------------------------------------------------------------------------------  Chemistries  Recent Labs  Lab 06/14/21 2008  NA 136  K 3.7  CL 104  CO2 27  GLUCOSE 92  BUN 18  CREATININE 0.72  CALCIUM 8.9  AST 16  ALT 13  ALKPHOS 67  BILITOT 0.3   ------------------------------------------------------------------------------------------------------------------  Cardiac Enzymes No results for input(s): TROPONINI in the last 168 hours. ------------------------------------------------------------------------------------------------------------------  RADIOLOGY:  CT HEAD CODE STROKE WO CONTRAST  Result Date: 06/14/2021 CLINICAL DATA:  Code stroke. Acute neurologic deficit. Sudden onset headache and right arm numbness. EXAM: CT HEAD WITHOUT CONTRAST TECHNIQUE: Contiguous axial images were obtained from the base of the skull through  the vertex without intravenous contrast. RADIATION DOSE REDUCTION: This exam was performed according to the departmental dose-optimization program which includes automated exposure control, adjustment of the mA and/or kV according to patient size and/or use of iterative reconstruction technique. COMPARISON:  None. FINDINGS: Brain: There is no mass, hemorrhage or extra-axial collection. The size and configuration of the ventricles and extra-axial CSF spaces are normal. The brain parenchyma is normal, without evidence of acute or chronic infarction. Vascular: No abnormal hyperdensity of the major intracranial arteries or dural venous sinuses. No intracranial atherosclerosis. Skull: The visualized skull base, calvarium and extracranial soft tissues are normal. Sinuses/Orbits: No fluid levels or advanced mucosal thickening of the visualized paranasal sinuses. No mastoid or middle ear effusion. The orbits are normal. ASPECTS [facility] Stroke Program Early CT Score) - Ganglionic level infarction (caudate, lentiform nuclei, internal capsule, insula, M1-M3 cortex): 7 - Supraganglionic infarction (M4-M6 cortex): 3 Total score (0-10 with 10 being normal): 10 IMPRESSION: 1. Normal head CT. 2. ASPECTS is 10. These results were called by telephone at the time of interpretation on 06/14/2021 at 7:59 pm to provider PHILLIP STAFFORD , who verbally acknowledged these results. Electronically Signed   By: Ulyses Jarred M.D.   On: 06/14/2021 19:59  IMPRESSION AND PLAN:  Principal Problem:   Ozzie (transient ischemic attack)  1.  Right upper extremity weakness and numbness, with differential diagnosis including Alese and possibly atypical migraine. - The patient admitted to an observation medical telemetry bed. - We will follow neurochecks every 4 hours for 24 hours. - We will place on aspirin and Plavix. - We will obtain 2D echo with bubble study. - PT/OT and ST consults will be obtained. - Neurology follow-up consultation  will be obtained. - Dr. Theda Sers was notified about the patient.  2.  COVID-19 infection.  This could be contributing partly to her symptoms. - The patient be placed on IV remdesivir. - We will place her on vitamin C, vitamin D3 and zinc sulfate. - She will be placed on aspirin as mentioned above. - We will follow inflammatory markers.  3.  Migraine headaches. - We will place her on p.o. Fioricet as needed.  DVT prophylaxis: Lovenox. Advanced Care Planning:  Code Status: full code. Family Communication:  The plan of care was discussed in details with the patient (and family). I answered all questions. The patient agreed to proceed with the above mentioned plan. Further management will depend upon hospital course. Disposition Plan: Back to previous home environment Consults called: Neurology.   All the records are reviewed and case discussed with ED provider.  Status is: Observation  I certify that at the time of admission, it is my clinical judgment that the patient will require inpatient hospital care extending less than 2 midnights.                            Dispo: The patient is from: Home              Anticipated d/c is to: Home              Patient currently is not medically stable to d/c.              Difficult to place patient: No   Christel Mormon M.D on 06/14/2021 at 9:21 PM  [facility]   From 7 PM-7 AM, contact night-coverage [email or web address]  CC: Primary care physician; Patient, No Pcp Per (Inactive)

## 2021-06-14 NOTE — ED Notes (Signed)
Pt arrives to ED room. Per Neuro, no TPA at this time.

## 2021-06-14 NOTE — ED Notes (Signed)
Pt returned from MRI °

## 2021-06-15 ENCOUNTER — Other Ambulatory Visit: Payer: Self-pay

## 2021-06-15 ENCOUNTER — Observation Stay (HOSPITAL_BASED_OUTPATIENT_CLINIC_OR_DEPARTMENT_OTHER)
Admit: 2021-06-15 | Discharge: 2021-06-15 | Disposition: A | Discharge status: Home / Self Care | Payer: Self-pay | Attending: Family Medicine | Admitting: Family Medicine

## 2021-06-15 DIAGNOSIS — D649 Anemia, unspecified: Secondary | ICD-10-CM

## 2021-06-15 DIAGNOSIS — M778 Other enthesopathies, not elsewhere classified: Secondary | ICD-10-CM

## 2021-06-15 DIAGNOSIS — M792 Neuralgia and neuritis, unspecified: Secondary | ICD-10-CM

## 2021-06-15 DIAGNOSIS — M7521 Bicipital tendinitis, right shoulder: Secondary | ICD-10-CM

## 2021-06-15 DIAGNOSIS — U071 COVID-19: Secondary | ICD-10-CM

## 2021-06-15 DIAGNOSIS — E7849 Other hyperlipidemia: Secondary | ICD-10-CM

## 2021-06-15 DIAGNOSIS — G459 Transient cerebral ischemic attack, unspecified: Secondary | ICD-10-CM

## 2021-06-15 LAB — PROCALCITONIN: Procalcitonin: <0.1 ng/mL

## 2021-06-15 LAB — ECHOCARDIOGRAM LIMITED BUBBLE STUDY
Calc EF: 49.4 %
S' Lateral: 4.1 cm
Single Plane A2C EF: 47.1 %
Single Plane A4C EF: 51.6 %

## 2021-06-15 LAB — BRAIN NATRIURETIC PEPTIDE: B Natriuretic Peptide: 5.4 pg/mL (ref 0.0–100.0)

## 2021-06-15 LAB — LIPID PANEL
Cholesterol: 163 mg/dL (ref 0–200)
HDL: 43 mg/dL (ref >40)
LDL Cholesterol: 113 mg/dL — ABNORMAL HIGH (ref 0–99)
Total CHOL/HDL Ratio: 3.8 RATIO
Triglycerides: 37 mg/dL (ref <150)
VLDL: 7 mg/dL (ref 0–40)

## 2021-06-15 LAB — FERRITIN: Ferritin: 6 ng/mL — ABNORMAL LOW (ref 11–307)

## 2021-06-15 LAB — HEMOGLOBIN A1C
Hgb A1c MFr Bld: 5.4 % (ref 4.8–5.6)
Mean Plasma Glucose: 108.28 mg/dL

## 2021-06-15 LAB — LACTATE DEHYDROGENASE: LDH: 80 U/L — ABNORMAL LOW (ref 98–192)

## 2021-06-15 LAB — D-DIMER, QUANTITATIVE: D-Dimer, Quant: 0.43 ug/mL-FEU (ref 0.00–0.50)

## 2021-06-15 LAB — TROPONIN I (HIGH SENSITIVITY)
Troponin I (High Sensitivity): <2 ng/L (ref <18)
Troponin I (High Sensitivity): 3 ng/L (ref <18)

## 2021-06-15 LAB — HIV ANTIBODY (ROUTINE TESTING W REFLEX): HIV Screen 4th Generation wRfx: NONREACTIVE

## 2021-06-15 LAB — GLUCOSE, CAPILLARY: Glucose-Capillary: 79 mg/dL (ref 70–99)

## 2021-06-15 LAB — C-REACTIVE PROTEIN: CRP: 1.3 mg/dL — ABNORMAL HIGH (ref <1.0)

## 2021-06-15 MED ORDER — BUTALBITAL-APAP-CAFFEINE 50-325-40 MG PO TABS: 1.0000 | ORAL_TABLET | ORAL | Status: DC | PRN

## 2021-06-15 MED ORDER — VITAMIN D 25 MCG (1000 UNIT) PO TABS
1000.0000 [IU] | ORAL_TABLET | Freq: Every day | ORAL | Status: DC
  Administered 2021-06-15: 1000 [IU] via ORAL
  Filled 2021-06-15: qty 1

## 2021-06-15 MED ORDER — SODIUM CHLORIDE 0.9 % IV SOLN
100.0000 mg | Freq: Every day | INTRAVENOUS | Status: DC
  Filled 2021-06-15 (×2): qty 20

## 2021-06-15 MED ORDER — METHYLPREDNISOLONE SODIUM SUCC 40 MG IJ SOLR
40.0000 mg | Freq: Once | INTRAMUSCULAR | Status: AC
  Administered 2021-06-15: 15:00:00 40 mg via INTRAVENOUS
  Filled 2021-06-15: qty 1

## 2021-06-15 MED ORDER — MELOXICAM 7.5 MG PO TABS
7.5000 mg | ORAL_TABLET | Freq: Every day | ORAL | 0 refills | Status: AC
  Filled 2021-06-15: qty 14, 14d supply, fill #0

## 2021-06-15 MED ORDER — SODIUM CHLORIDE 0.9 % IV SOLN
200.0000 mg | Freq: Once | INTRAVENOUS | Status: AC
  Administered 2021-06-15: 02:00:00 200 mg via INTRAVENOUS
  Filled 2021-06-15: qty 200

## 2021-06-15 MED ORDER — FLUTICASONE PROPIONATE 50 MCG/ACT NA SUSP
2.0000 | Freq: Every day | NASAL | 0 refills | Status: AC
  Filled 2021-06-15: qty 16, 30d supply, fill #0

## 2021-06-15 MED ORDER — GUAIFENESIN-DM 100-10 MG/5ML PO SYRP: 10.0000 mL | ORAL_SOLUTION | ORAL | Status: DC | PRN

## 2021-06-15 MED ORDER — ASCORBIC ACID 500 MG PO TABS
500.0000 mg | ORAL_TABLET | Freq: Every day | ORAL | 0 refills | Status: AC
Start: 2021-06-16 — End: ?
  Filled 2021-06-15: qty 30, 30d supply, fill #0

## 2021-06-15 MED ORDER — ASCORBIC ACID 500 MG PO TABS
500.0000 mg | ORAL_TABLET | Freq: Every day | ORAL | Status: DC
  Administered 2021-06-15: 500 mg via ORAL
  Filled 2021-06-15: qty 1

## 2021-06-15 MED ORDER — ZINC SULFATE 220 (50 ZN) MG PO CAPS
220.0000 mg | ORAL_CAPSULE | Freq: Every day | ORAL | 0 refills | Status: AC
  Filled 2021-06-15: qty 30, 30d supply, fill #0

## 2021-06-15 MED ORDER — FLUTICASONE PROPIONATE 50 MCG/ACT NA SUSP
2.0000 | Freq: Every day | NASAL | Status: DC
  Administered 2021-06-15: 2 via NASAL
  Filled 2021-06-15: qty 16

## 2021-06-15 MED ORDER — SODIUM CHLORIDE 0.9 % IV SOLN: 100.0000 mg | Freq: Every day | INTRAVENOUS | Status: DC

## 2021-06-15 MED ORDER — ZINC SULFATE 220 (50 ZN) MG PO CAPS
220.0000 mg | ORAL_CAPSULE | Freq: Every day | ORAL | Status: DC
  Administered 2021-06-15: 10:00:00 220 mg via ORAL
  Filled 2021-06-15: qty 1

## 2021-06-15 MED ORDER — HYDROCOD POLI-CHLORPHE POLI ER 10-8 MG/5ML PO SUER: 5.0000 mL | Freq: Two times a day (BID) | ORAL | Status: DC | PRN

## 2021-06-15 NOTE — Discharge Instructions (Signed)
Stay out of work for 10 days with covid and tendonitis

## 2021-06-15 NOTE — TOC Initial Note (Signed)
Transition of Care [facility]) - Initial/Assessment Note    Patient Details  Name: Anita Hodge MRN: [id number] Date of Birth: 11-01-1985  [facility] CM/SW Contact:    Anita Pelt, RN Phone Number: 06/15/2021, 11:18 AM  Clinical Narrative:   Patient states she does not live alone and will have assistance at home if required.  She has a primary care provider, who is at [facility] and is up to date on visits.  Patient has no transportation concerns at this time.  Patient is able to take medications as directed and states she does not have insurance at this time.  RNCM explained that medications can be sent to medication management to assist.  Patient has transportation and was grateful for assistance.    Patient does not currently have home health or DME. TOC contact information provided to patient, TOC will follow for needs.                Expected Discharge Plan:  (TBD) Barriers to Discharge: Continued Medical Work up   Patient Goals and CMS Choice     Choice offered to / list presented to : NA  Expected Discharge Plan and Services Expected Discharge Plan:  (TBD)   Discharge Planning Services: CM Consult Post Acute Care Choice:  (TBD) Living arrangements for the past 2 months: Single Family Home                                      Prior Living Arrangements/Services Living arrangements for the past 2 months: Single Family Home Lives with:: Self, Relatives Patient language and need for interpreter reviewed:: Yes (No interpreter required) Do you feel safe going back to the place where you live?: Yes      Need for Family Participation in Patient Care: Yes (Comment) Care giver support system in place?: Yes (comment) Current home services:  (No current home services) Criminal Activity/Legal Involvement Pertinent to Current Situation/Hospitalization: No - Comment as needed  Activities of Daily Living Home Assistive Devices/Equipment: None ADL  Screening (condition at time of admission) Patient's cognitive ability adequate to safely complete daily activities?: Yes Is the patient deaf or have difficulty hearing?: No Does the patient have difficulty seeing, even when wearing glasses/contacts?: No Does the patient have difficulty concentrating, remembering, or making decisions?: No Patient able to express need for assistance with ADLs?: Yes Does the patient have difficulty dressing or bathing?: No Independently performs ADLs?: Yes (appropriate for developmental age) Does the patient have difficulty walking or climbing stairs?: No Weakness of Legs: None Weakness of Arms/Hands: None  Permission Sought/Granted Permission sought to share information with : Case Manager Permission granted to share information with : Yes, Verbal Permission Granted              Emotional Assessment Appearance:: Appears stated age Attitude/Demeanor/Rapport: Gracious, Engaged Affect (typically observed): Pleasant, Appropriate Orientation: : Oriented to Self, Oriented to Place, Oriented to  Time, Oriented to Situation Alcohol / Substance Use: Not Applicable Psych Involvement: No (comment)  Admission diagnosis:  Anita Hodge (transient ischemic attack) [G45.9] Patient Active Problem List   Diagnosis Date Noted   Anita Hodge (transient ischemic attack) 06/14/2021   PCP:  Patient, No Pcp Per (Inactive) Pharmacy:  No Pharmacies Listed    Social Determinants of Health (SDOH) Interventions    Readmission Risk Interventions No flowsheet data found.

## 2021-06-15 NOTE — Progress Notes (Signed)
OT Cancellation Note  Patient Details Name: Anita Hodge MRN: [id number] DOB: 1986/03/14   Cancelled Treatment:    Reason Eval/Treat Not Completed: Patient at procedure or test/ unavailable Pt currently undergoing echo with bubble study. Will re-attempt for OT evaluation as able. Thank you.  Rejeana Brock, MS, OTR/L ascom [phone] 06/15/21, 9:36 AM

## 2021-06-15 NOTE — Progress Notes (Signed)
PT Cancellation Note  Patient Details Name: Anita Hodge MRN: [id number] DOB: Nov 30, 1985   Cancelled Treatment:    Reason Eval/Treat Not Completed: PT screened, no needs identified, will sign off. Patient ambulated twice around nurses station with OT, no assist needed. Patient does not require skilled PT at this time.    Akela Pocius 06/15/2021, 11:34 AM

## 2021-06-15 NOTE — Discharge Summary (Signed)
Physician Discharge Summary   Patient: Anita Hodge MRN: [id number] DOB: 08-16-1985  Admit date:     06/14/2021  Discharge date: 06/15/21  Discharge Physician: Alford Highland   PCP: Patient's PCP is at [facility]  Recommendations at discharge:   Follow-up PCP 5 days Follow-up orthopedic surgery if no improvement in right shoulder  Discharge Diagnoses:  Right deltoid insertion tendinitis Right proximal bicep tendinitis COVID-19 infection Morbid obesity Anemia unspecified   Hospital Course: The patient was brought in as an observation for right-sided numbness and weakness.  The patient works as a Interior and spatial designer.  The patient was seen in consultation by neurology.  Stroke work-up including MRI of the brain, MRA of the neck MRI of the cervical spine was all negative.  Bubble echocardiogram still pending.  Case discussed with neurology and we believe this is more likely musculoskeletal in nature with pain over the right deltoid insertion and proximal bicep on physical exam.  I did give 1 dose of Solu-Medrol and Mobic to go home with.  Can follow-up with orthopedic surgery if no improvement in 2 weeks.  I prescribed 14 days of Mobic recommending staying home for at least 10 days to give her arm some rest and to recover from COVID.  This is not a [place] or stroke.  The patient was given 2 doses of remdesivir but felt well enough to go home.  Follow-up PCP 5 days         Consultants: Neurology Procedures performed: None Disposition: Home Diet recommendation:  Regular diet  DISCHARGE MEDICATION: Allergies as of 06/15/2021       Reactions   Penicillins    Unknown reaction "since birth"        Medication List     STOP taking these medications    acetaminophen-codeine 300-30 MG tablet Commonly known as: TYLENOL #3   cyclobenzaprine 10 MG tablet Commonly known as: FLEXERIL   predniSONE 10 MG (21) Tbpk tablet Commonly known as: STERAPRED UNI-PAK 21 TAB       TAKE these  medications    ascorbic acid 500 MG tablet Commonly known as: VITAMIN C Take 1 tablet (500 mg total) by mouth once daily. Start taking on: June 16, 2021   fluticasone 50 MCG/ACT nasal spray Commonly known as: FLONASE Spray 2 sprays into both nostrils once daily.   meloxicam 7.5 MG tablet Commonly known as: Mobic Take 1 tablet (7.5 mg total) by mouth once daily for 14 days. Start taking on: June 16, 2021   zinc sulfate 220 (50 Zn) MG capsule Take 1 capsule (220 mg total) by mouth once daily. Start taking on: June 16, 2021        Follow-up Information     Poggi, Excell Seltzer, MD. Go on 06/29/2021.   Specialty: Orthopedic Surgery Why: @3 :30pm Contact information: [address] [facility] [place] [address] [phone]         Your medical doctor at [facility] Follow up in 5 day(s).                  Discharge Exam: Filed Weights   06/14/21 2033  Weight: (!) 142.9 kg   Physical Exam HENT:     Head: Normocephalic.     Mouth/Throat:     Pharynx: No oropharyngeal exudate.  Eyes:     General: Lids are normal.     Conjunctiva/sclera: Conjunctivae normal.  Cardiovascular:     Rate and Rhythm: Normal rate and regular rhythm.     Heart  sounds: Normal heart sounds, S1 normal and S2 normal.  Pulmonary:     Breath sounds: No decreased breath sounds, wheezing, rhonchi or rales.  Abdominal:     Palpations: Abdomen is soft.     Tenderness: There is no abdominal tenderness.  Musculoskeletal:     Comments: Right shoulder pain over the lateral deltoid insertion and proximal biceps head.  Skin:    General: Skin is warm.     Findings: No rash.  Neurological:     Mental Status: She is alert and oriented to person, place, and time.     Condition at discharge: stable  The results of significant diagnostics from this hospitalization (including imaging, microbiology, ancillary and laboratory) are listed below for reference.   Imaging  Studies: MR ANGIO HEAD WO CONTRAST  Result Date: 06/14/2021 CLINICAL DATA:  Right arm numbness EXAM: MRI HEAD WITHOUT CONTRAST MRA HEAD WITHOUT CONTRAST MRA NECK WITHOUT CONTRAST TECHNIQUE: Multiplanar, multiecho pulse sequences of the brain and surrounding structures were obtained without intravenous contrast. Angiographic images of the Circle of Willis were obtained using MRA technique without intravenous contrast. Angiographic images of the neck were obtained using MRA technique without intravenous contrast. Carotid stenosis measurements (when applicable) are obtained utilizing NASCET criteria, using the distal internal carotid diameter as the denominator. COMPARISON:  None. FINDINGS: MRI HEAD FINDINGS Brain: No acute infarct, mass effect or extra-axial collection. No acute or chronic hemorrhage. Normal white matter signal, parenchymal volume and CSF spaces. The midline structures are normal. Vascular: Major flow voids are preserved. Skull and upper cervical spine: Normal calvarium and skull base. Visualized upper cervical spine and soft tissues are normal. Sinuses/Orbits:No paranasal sinus fluid levels or advanced mucosal thickening. No mastoid or middle ear effusion. Normal orbits. MRA HEAD FINDINGS POSTERIOR CIRCULATION: --Vertebral arteries: Normal --Inferior cerebellar arteries: Normal. --Basilar artery: Normal. --Superior cerebellar arteries: Normal. --Posterior cerebral arteries: Normal. ANTERIOR CIRCULATION: --Intracranial internal carotid arteries: Normal. --Anterior cerebral arteries (ACA): Normal. --Middle cerebral arteries (MCA): Normal. ANATOMIC VARIANTS: Both posterior communicating arteries are patent. P1 segments are diminutive or absent. MRA NECK FINDINGS There is normal antegrade flow demonstrated in the vertebral and carotid systems. No hemodynamically significant stenosis. IMPRESSION: 1. Normal MRI of the brain. 2. Normal MRA of the head and neck. Electronically Signed   By: Deatra Robinson  M.D.   On: 06/14/2021 23:40   MR ANGIO NECK WO CONTRAST  Result Date: 06/14/2021 CLINICAL DATA:  Right arm numbness EXAM: MRI HEAD WITHOUT CONTRAST MRA HEAD WITHOUT CONTRAST MRA NECK WITHOUT CONTRAST TECHNIQUE: Multiplanar, multiecho pulse sequences of the brain and surrounding structures were obtained without intravenous contrast. Angiographic images of the Circle of Willis were obtained using MRA technique without intravenous contrast. Angiographic images of the neck were obtained using MRA technique without intravenous contrast. Carotid stenosis measurements (when applicable) are obtained utilizing NASCET criteria, using the distal internal carotid diameter as the denominator. COMPARISON:  None. FINDINGS: MRI HEAD FINDINGS Brain: No acute infarct, mass effect or extra-axial collection. No acute or chronic hemorrhage. Normal white matter signal, parenchymal volume and CSF spaces. The midline structures are normal. Vascular: Major flow voids are preserved. Skull and upper cervical spine: Normal calvarium and skull base. Visualized upper cervical spine and soft tissues are normal. Sinuses/Orbits:No paranasal sinus fluid levels or advanced mucosal thickening. No mastoid or middle ear effusion. Normal orbits. MRA HEAD FINDINGS POSTERIOR CIRCULATION: --Vertebral arteries: Normal --Inferior cerebellar arteries: Normal. --Basilar artery: Normal. --Superior cerebellar arteries: Normal. --Posterior cerebral arteries: Normal. ANTERIOR CIRCULATION: --Intracranial internal carotid  arteries: Normal. --Anterior cerebral arteries (ACA): Normal. --Middle cerebral arteries (MCA): Normal. ANATOMIC VARIANTS: Both posterior communicating arteries are patent. P1 segments are diminutive or absent. MRA NECK FINDINGS There is normal antegrade flow demonstrated in the vertebral and carotid systems. No hemodynamically significant stenosis. IMPRESSION: 1. Normal MRI of the brain. 2. Normal MRA of the head and neck. Electronically Signed    By: Deatra RobinsonKevin  Herman M.D.   On: 06/14/2021 23:40   MR BRAIN WO CONTRAST  Result Date: 06/14/2021 CLINICAL DATA:  Right arm numbness EXAM: MRI HEAD WITHOUT CONTRAST MRA HEAD WITHOUT CONTRAST MRA NECK WITHOUT CONTRAST TECHNIQUE: Multiplanar, multiecho pulse sequences of the brain and surrounding structures were obtained without intravenous contrast. Angiographic images of the Circle of Willis were obtained using MRA technique without intravenous contrast. Angiographic images of the neck were obtained using MRA technique without intravenous contrast. Carotid stenosis measurements (when applicable) are obtained utilizing NASCET criteria, using the distal internal carotid diameter as the denominator. COMPARISON:  None. FINDINGS: MRI HEAD FINDINGS Brain: No acute infarct, mass effect or extra-axial collection. No acute or chronic hemorrhage. Normal white matter signal, parenchymal volume and CSF spaces. The midline structures are normal. Vascular: Major flow voids are preserved. Skull and upper cervical spine: Normal calvarium and skull base. Visualized upper cervical spine and soft tissues are normal. Sinuses/Orbits:No paranasal sinus fluid levels or advanced mucosal thickening. No mastoid or middle ear effusion. Normal orbits. MRA HEAD FINDINGS POSTERIOR CIRCULATION: --Vertebral arteries: Normal --Inferior cerebellar arteries: Normal. --Basilar artery: Normal. --Superior cerebellar arteries: Normal. --Posterior cerebral arteries: Normal. ANTERIOR CIRCULATION: --Intracranial internal carotid arteries: Normal. --Anterior cerebral arteries (ACA): Normal. --Middle cerebral arteries (MCA): Normal. ANATOMIC VARIANTS: Both posterior communicating arteries are patent. P1 segments are diminutive or absent. MRA NECK FINDINGS There is normal antegrade flow demonstrated in the vertebral and carotid systems. No hemodynamically significant stenosis. IMPRESSION: 1. Normal MRI of the brain. 2. Normal MRA of the head and neck.  Electronically Signed   By: Deatra RobinsonKevin  Herman M.D.   On: 06/14/2021 23:40   MR CERVICAL SPINE WO CONTRAST  Result Date: 06/14/2021 CLINICAL DATA:  Right arm numbness EXAM: MRI CERVICAL SPINE WITHOUT CONTRAST TECHNIQUE: Multiplanar, multisequence MR imaging of the cervical spine was performed. No intravenous contrast was administered. COMPARISON:  None. FINDINGS: Alignment: Physiologic. Vertebrae: No fracture, evidence of discitis, or bone lesion. Cord: Normal signal and morphology. Posterior Fossa, vertebral arteries, paraspinal tissues: Negative. Disc levels: There is no spinal canal or neural foraminal stenosis. No disc herniation. IMPRESSION: Normal MRI of the cervical spine. Electronically Signed   By: Deatra RobinsonKevin  Herman M.D.   On: 06/14/2021 23:42   CT HEAD CODE STROKE WO CONTRAST  Result Date: 06/14/2021 CLINICAL DATA:  Code stroke. Acute neurologic deficit. Sudden onset headache and right arm numbness. EXAM: CT HEAD WITHOUT CONTRAST TECHNIQUE: Contiguous axial images were obtained from the base of the skull through the vertex without intravenous contrast. RADIATION DOSE REDUCTION: This exam was performed according to the departmental dose-optimization program which includes automated exposure control, adjustment of the mA and/or kV according to patient size and/or use of iterative reconstruction technique. COMPARISON:  None. FINDINGS: Brain: There is no mass, hemorrhage or extra-axial collection. The size and configuration of the ventricles and extra-axial CSF spaces are normal. The brain parenchyma is normal, without evidence of acute or chronic infarction. Vascular: No abnormal hyperdensity of the major intracranial arteries or dural venous sinuses. No intracranial atherosclerosis. Skull: The visualized skull base, calvarium and extracranial soft tissues are normal. Sinuses/Orbits: No  fluid levels or advanced mucosal thickening of the visualized paranasal sinuses. No mastoid or middle ear effusion. The  orbits are normal. ASPECTS [facility]([place] Stroke Program Early CT Score) - Ganglionic level infarction (caudate, lentiform nuclei, internal capsule, insula, M1-M3 cortex): 7 - Supraganglionic infarction (M4-M6 cortex): 3 Total score (0-10 with 10 being normal): 10 IMPRESSION: 1. Normal head CT. 2. ASPECTS is 10. These results were called by telephone at the time of interpretation on 06/14/2021 at 7:59 pm to provider PHILLIP STAFFORD , who verbally acknowledged these results. Electronically Signed   By: Deatra RobinsonKevin  Herman M.D.   On: 06/14/2021 19:59    Microbiology: Results for orders placed or performed during the hospital encounter of 06/14/21  Resp Panel by RT-PCR (Flu A&B, Covid) Nasopharyngeal Swab     Status: Abnormal   Collection Time: 06/14/21  9:33 PM   Specimen: Nasopharyngeal Swab; Nasopharyngeal(NP) swabs in vial transport medium  Result Value Ref Range Status   SARS Coronavirus 2 by RT PCR POSITIVE (A) NEGATIVE Final    Comment: (NOTE) SARS-CoV-2 target nucleic acids are DETECTED.  The SARS-CoV-2 RNA is generally detectable in upper respiratory specimens during the acute phase of infection. Positive results are indicative of the presence of the identified virus, but do not rule out bacterial infection or co-infection with other pathogens not detected by the test. Clinical correlation with patient history and other diagnostic information is necessary to determine patient infection status. The expected result is Negative.  Fact Sheet for Patients: BloggerCourse.comhttps://www.fda.gov/media/152166/download  Fact Sheet for Healthcare Providers: SeriousBroker.ithttps://www.fda.gov/media/152162/download  This test is not yet approved or cleared by the [place] FDA and  has been authorized for detection and/or diagnosis of SARS-CoV-2 by FDA under an Emergency Use Authorization (EUA).  This EUA will remain in effect (meaning this test can be used) for the duration of  the COVID-19 declaration under Section 564(b)(1) of  the A ct, 21 U.S.C. section 360bbb-3(b)(1), unless the authorization is terminated or revoked sooner.     Influenza A by PCR NEGATIVE NEGATIVE Final   Influenza B by PCR NEGATIVE NEGATIVE Final    Comment: (NOTE) The Xpert Xpress SARS-CoV-2/FLU/RSV plus assay is intended as an aid in the diagnosis of influenza from Nasopharyngeal swab specimens and should not be used as a sole basis for treatment. Nasal washings and aspirates are unacceptable for Xpert Xpress SARS-CoV-2/FLU/RSV testing.  Fact Sheet for Patients: BloggerCourse.comhttps://www.fda.gov/media/152166/download  Fact Sheet for Healthcare Providers: SeriousBroker.ithttps://www.fda.gov/media/152162/download  This test is not yet approved or cleared by the [place] FDA and has been authorized for detection and/or diagnosis of SARS-CoV-2 by FDA under an Emergency Use Authorization (EUA). This EUA will remain in effect (meaning this test can be used) for the duration of the COVID-19 declaration under Section 564(b)(1) of the Act, 21 U.S.C. section 360bbb-3(b)(1), unless the authorization is terminated or revoked.  Performed at [facility], [address] [place], [place] [address]     Labs: CBC: Recent Labs  Lab 06/14/21 2008  WBC 6.8  NEUTROABS 3.2  HGB 9.3*  HCT 31.3*  MCV 78.8*  PLT 368   Basic Metabolic Panel: Recent Labs  Lab 06/14/21 2008  NA 136  K 3.7  CL 104  CO2 27  GLUCOSE 92  BUN 18  CREATININE 0.72  CALCIUM 8.9   Liver Function Tests: Recent Labs  Lab 06/14/21 2008  AST 16  ALT 13  ALKPHOS 67  BILITOT 0.3  PROT 8.3*  ALBUMIN 3.4*   CBG: Recent Labs  Lab  06/14/21 1944  GLUCAP 79    Discharge time spent: greater than 30 minutes.  Signed: Alford Highland, MD [facility] 06/15/2021

## 2021-06-15 NOTE — Progress Notes (Signed)
*  PRELIMINARY RESULTS* Echocardiogram 2D Echocardiogram has been performed.  Anita Hodge 06/15/2021, 9:47 AM

## 2021-06-15 NOTE — Progress Notes (Signed)
SLP Cancellation Note  Patient Details Name: JAYLYNE BREESE MRN: [id number] DOB: 06-18-1985   Cancelled treatment:       Reason Eval/Treat Not Completed: SLP screened, no needs identified, will sign off (chart reviewed; consulted NSG then met w/ pt) Pt denied any difficulty swallowing and is currently on a regular diet; tolerates swallowing pills w/ water per NSG. Pt conversed in conversation w/out expressive/receptive deficits noted; pt denied any speech-language deficits. Speech clear. No further skilled ST services indicated as pt appears at her baseline. Pt agreed. NSG to reconsult if any change in status while admitted.       Orinda Kenner, MS, CCC-SLP Speech Language Pathologist Rehab Services; [place] [phone] (ascom) [place] 06/15/2021, 11:05 AM

## 2021-06-15 NOTE — Progress Notes (Addendum)
Neurology Progress Note Anita Hodge MR# [id number] 06/15/2021  S: no overnight events; no new complaints.  O: Current vital signs: BP (!) 114/92 (BP Location: Right Arm)    Pulse 83    Temp (!) 97.5 F (36.4 C) (Oral)    Resp 20    Ht 5\' 9"  (1.753 m)    Wt (!) 142.9 kg    SpO2 98%    BMI 46.52 kg/m  Vital signs in last 24 hours: Temp:  [97.5 F (36.4 C)-98.4 F (36.9 C)] 97.5 F (36.4 C) (02/15 0740) Pulse Rate:  [71-95] 83 (02/15 0740) Resp:  [16-21] 20 (02/15 0740) BP: (113-167)/(69-114) 114/92 (02/15 0740) SpO2:  [97 %-100 %] 98 % (02/15 0740) Weight:  [142.9 kg] 142.9 kg (02/14 2033) GENERAL: Awake, alert in NAD HEENT: Normocephalic and atraumatic, moist mm, no LN++, no thyromegaly LUNGS: symmetric excursions bilaterally with no audible wheezes. CV: RR, equal pulses bilaterally. ABDOMEN: Soft, nontender, nondistended with normoactive BS Ext: warm, well perfused, intact peripheral pulses  NEURO:  Mental Status: AA&Ox3  Language: speech is fluent.  Intact naming, repetition, and comprehension. PERR. EOMI, visual fields full, no facial asymmetry, facial sensation intact, hearing intact. No evidence of tongue atrophy or fibrillations, tongue/uvula/soft palate midline elevates symmetrically  Normal sternocleidomastoid and trapezius muscle strength. Motor: good strength in all extremities. Tone: Tone and bulk is normal Sensation: Intact to light touch bilaterally Coordination: FTN intact bilaterally, no ataxia in BLE. Gait - Deferred  NIHSS 0  Labs  Lab Results  Component Value Date   HGBA1C 5.3 06/14/2021   and  Lab Results  Component Value Date   LDLCALC 113 (H) 06/15/2021    Latest Reference Range & Units 06/14/21 21:33  SARS Coronavirus 2 by RT PCR NEGATIVE  POSITIVE !    Imaging I have reviewed images in epic and the results pertinent to this consultation are: MRI Brain showed no ischemic changes. MRA head and neck was normal without significant flow  limiting stenosis. MRI cervical spine normal.  Assessment: Anita Hodge is a 36 y.o. female PMHx remote migraine with acute right arm numbness starting at shoulder and spreading to hand associated with headache, nausea and photophobia. Arm numbness is likely radicular strain due to prolonged arm positioning as a 30.   Impression: Right upper extremity radicular pain. Headache. COVID. HLD.   Recommendations: - Follow up with PCP. - Neurology will remain available, please call for questions.  Electronically signed by:  Interior and spatial designer, MD Page: Marisue Humble 06/15/2021, 8:12 AM  If 7pm- 7am, please page neurology on call as listed in AMION.

## 2021-06-15 NOTE — Evaluation (Signed)
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 CT and MRI negative for acute intracranial abnormalities. w/u for potential Perrie versus atypical migraine.   Clinical Impression   Pt seen for OT evaluation this date in setting of acute hospitalization w/ R side weakness. She presents this date reporting it has moderately resolved since yesterday. On assessment, R UE strength is grossly 4/5 while L UE strength is grossly 5/5. 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 OT educates re: role and FMC exercises. While pt is able to complete most basic tasks and mobility w/ no assist, she is noted to have increased difficulty with necessary [facility] tasks such as opening food containers and toiletry items. She also has increased difficulty managing her cell phone, coins, and other small manipulatives. She is able to complete most of these tasks with increased time (MOD I), but does require some physical assistance for bimanual tasks (ex: MIN A To apply toothpaste to toothbrush). Ultimately, OPOT f/u for [facility] skills is recommended to allow pt to return to her I/ADLs including working.      Recommendations for follow up therapy are one component of a multi-disciplinary discharge planning process, led by the attending physician.  Recommendations may be updated based on patient status, additional functional criteria and insurance authorization.   Follow Up Recommendations  Outpatient OT    Assistance Recommended at Discharge None  Patient can return home with the following Assistance with cooking/housework;Assist for transportation    Functional Status Assessment  Patient has had a recent  decline in their functional status and demonstrates the ability to make significant improvements in function in a reasonable and predictable amount of time.  Equipment Recommendations  None recommended by OT    Recommendations for Other Services       Precautions / Restrictions Restrictions Weight Bearing Restrictions: No      Mobility Bed Mobility Overal bed mobility: Independent                  Transfers Overall transfer level: Modified independent                 General transfer comment: increased time, no AD, good balance      Balance Overall balance assessment: Modified Independent                                         ADL either performed or assessed with clinical judgement   ADL Overall ADL's : Modified independent                                       General ADL Comments: does require increased time for [facility] tasks such as opening food containers, screw tops and g/h tasks such as putting toothpaste on toothbrush and combing hair. She reports needing improved R FMC to return to work.     Vision Patient Visual Report: No change from baseline Vision Assessment?: Yes Eye Alignment: Within Functional Limits Ocular Range of Motion:  Within Functional Limits Alignment/Gaze Preference: Within Defined Limits Tracking/Visual Pursuits: Able to track stimulus in all quads without difficulty     Perception     Praxis      Pertinent Vitals/Pain Pain Assessment Pain Assessment: No/denies pain (vague chronic R low back pain from sciatica per patient)     Hand Dominance Right   Extremity/Trunk Assessment Upper Extremity Assessment Upper Extremity Assessment: LUE deficits/detail RUE Deficits / Details: ROM WFL, some decreased strength versus L side, ~4/5 RUE Coordination: decreased fine motor LUE Deficits / Details: WFL LUE Sensation: WNL LUE Coordination: WNL   Lower Extremity Assessment Lower Extremity  Assessment: Overall WFL for tasks assessed (R side somewhat favored which is is baseline per patient d/t sciatica.)       Communication Communication Communication: No difficulties   Cognition Arousal/Alertness: Awake/alert Behavior During Therapy: WFL for tasks assessed/performed Overall Cognitive Status: Within Functional Limits for tasks assessed                                       General Comments       Exercises Other Exercises Other Exercises: OT ed re: role, FMC exercises, BEFAST   Shoulder Instructions      Home Living Family/patient expects to be discharged to:: Private residence Living Arrangements: Parent;Children Available Help at Discharge: Family Type of Home: House Home Access: Stairs to enter     Home Layout: One level               Home Equipment: None          Prior Functioning/Environment Prior Level of Function : Independent/Modified Independent               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  OT Frequency: Min 2X/week    Co-evaluation              AM-PAC OT "6 Clicks" Daily Activity     Outcome Measure Help from another person eating meals?: A Little Help from another person taking care of personal grooming?: A Little Help from another person toileting, which includes using  toliet, bedpan, or urinal?: None Help from another person bathing (including washing, rinsing, drying)?: None Help from another person to put on and taking off regular upper body clothing?: A Little Help from another person to put on and taking off regular lower body clothing?: None 6 Click Score: 21   End of Session Nurse Communication: Mobility status  Activity Tolerance: Patient tolerated treatment well Patient left: Other (comment) (in restroom setup to wash up sink-side)  OT Visit Diagnosis: Other (comment) (R27.8 other lack of coordination)                Time: 1610-9604 OT Time Calculation (min): 33 min Charges:  OT General Charges $OT  Visit: 1 Visit OT Evaluation $OT Eval Moderate Complexity: 1 Mod OT Treatments $Neuromuscular Re-education: 8-22 mins  Rejeana Brock, MS, OTR/L ascom [phone] 06/15/21, 1:59 PM

## 2021-06-16 ENCOUNTER — Other Ambulatory Visit: Payer: Self-pay

## 2021-06-17 ENCOUNTER — Other Ambulatory Visit: Payer: Self-pay

## 2021-08-03 ENCOUNTER — Other Ambulatory Visit: Payer: Self-pay

## 2021-12-06 ENCOUNTER — Ambulatory Visit: Payer: Medicaid Other

## 2022-03-02 ENCOUNTER — Ambulatory Visit (LOCAL_COMMUNITY_HEALTH_CENTER): Payer: Medicaid Other | Admitting: Nurse Practitioner

## 2022-03-02 VITALS — BP 131/73 | HR 80 | Ht 69.0 in | Wt 351.4 lb

## 2022-03-02 DIAGNOSIS — Z30013 Encounter for initial prescription of injectable contraceptive: Secondary | ICD-10-CM | POA: Diagnosis not present

## 2022-03-02 DIAGNOSIS — Z3042 Encounter for surveillance of injectable contraceptive: Secondary | ICD-10-CM

## 2022-03-02 DIAGNOSIS — Z3009 Encounter for other general counseling and advice on contraception: Secondary | ICD-10-CM | POA: Diagnosis not present

## 2022-03-02 DIAGNOSIS — Z3202 Encounter for pregnancy test, result negative: Secondary | ICD-10-CM

## 2022-03-02 LAB — PREGNANCY, URINE: Preg Test, Ur: NEGATIVE

## 2022-03-02 MED ORDER — MEDROXYPROGESTERONE ACETATE 150 MG/ML IM SUSP
150.0000 mg | Freq: Once | INTRAMUSCULAR | Status: AC
  Administered 2022-03-02: 150 mg via INTRAMUSCULAR

## 2022-03-02 NOTE — Progress Notes (Signed)
Pt appointment for Physical and to restart Depo. No provider available to see pt. RN provided birth control counseling. Pt wanted to restart Depo. Reported unprotected sex since last period. Pregnancy test ordered, and negative result discussed with pt. FNP White provided a verbal order for 1x depo. RN administered as ordered and pt tolerated well. Appointment reminder card given, and pt encouraged to make a follow up appointment in 2-3 weeks for her physical and second PT test before leaving.

## 2022-07-19 DIAGNOSIS — E669 Obesity, unspecified: Secondary | ICD-10-CM | POA: Diagnosis not present

## 2022-07-19 DIAGNOSIS — R6884 Jaw pain: Secondary | ICD-10-CM | POA: Diagnosis not present

## 2022-07-19 DIAGNOSIS — Z880 Allergy status to penicillin: Secondary | ICD-10-CM | POA: Diagnosis not present

## 2022-07-19 DIAGNOSIS — K0889 Other specified disorders of teeth and supporting structures: Secondary | ICD-10-CM | POA: Diagnosis not present

## 2022-08-01 ENCOUNTER — Encounter: Payer: Self-pay | Admitting: Family Medicine

## 2022-08-01 ENCOUNTER — Ambulatory Visit: Payer: Medicaid Other

## 2022-08-01 ENCOUNTER — Ambulatory Visit (LOCAL_COMMUNITY_HEALTH_CENTER): Payer: Medicaid Other | Admitting: Family Medicine

## 2022-08-01 VITALS — BP 150/80 | HR 74 | Temp 97.9°F | Ht 70.0 in | Wt 340.6 lb

## 2022-08-01 DIAGNOSIS — B3731 Acute candidiasis of vulva and vagina: Secondary | ICD-10-CM

## 2022-08-01 DIAGNOSIS — Z3009 Encounter for other general counseling and advice on contraception: Secondary | ICD-10-CM

## 2022-08-01 DIAGNOSIS — Z3202 Encounter for pregnancy test, result negative: Secondary | ICD-10-CM

## 2022-08-01 DIAGNOSIS — Z309 Encounter for contraceptive management, unspecified: Secondary | ICD-10-CM | POA: Diagnosis not present

## 2022-08-01 DIAGNOSIS — Z113 Encounter for screening for infections with a predominantly sexual mode of transmission: Secondary | ICD-10-CM | POA: Diagnosis not present

## 2022-08-01 DIAGNOSIS — Z01419 Encounter for gynecological examination (general) (routine) without abnormal findings: Secondary | ICD-10-CM | POA: Diagnosis not present

## 2022-08-01 DIAGNOSIS — Z30013 Encounter for initial prescription of injectable contraceptive: Secondary | ICD-10-CM | POA: Diagnosis not present

## 2022-08-01 LAB — HM HIV SCREENING LAB: HM HIV Screening: NEGATIVE

## 2022-08-01 LAB — PREGNANCY, URINE: Preg Test, Ur: NEGATIVE

## 2022-08-01 LAB — WET PREP FOR TRICH, YEAST, CLUE: Trichomonas Exam: NEGATIVE

## 2022-08-01 MED ORDER — CLOTRIMAZOLE 1 % VA CREA: 1.0000 | TOPICAL_CREAM | Freq: Every day | VAGINAL | 0 refills | Status: AC

## 2022-08-01 MED ORDER — MEDROXYPROGESTERONE ACETATE 150 MG/ML IM SUSP
150.0000 mg | INTRAMUSCULAR | Status: AC
  Administered 2022-08-01 – 2023-03-07 (×2): 150 mg via INTRAMUSCULAR

## 2022-08-01 NOTE — Progress Notes (Signed)
Pt here for PE and to restart Depo.   Preg test: Negative Wet prep reviewed; treated for yeast.   Harland Dingwall, RN

## 2022-08-01 NOTE — Progress Notes (Signed)
[facility] [place] Number: [phone]  Family Planning Visit- Repeat Yearly Visit  Subjective:  Anita Hodge is a 37 y.o. G2P2002  being seen today for an annual wellness visit and to discuss contraception options.   The patient is currently using No Method - Other Reason for pregnancy prevention. Patient does not want a pregnancy in the next year.    report they are looking for a method that provides High efficacy at preventing pregnancy   Patient has the following medical problems: has Anita Hodge (transient ischemic attack); Deltoid tendonitis of right shoulder; Bicipital tendonitis of right shoulder; COVID-19 virus infection; Obesity, Class III, BMI 40-49.9 (morbid obesity); and Anemia on their problem list.  Chief Complaint  Patient presents with   Contraception    Pt here for a PE and to restart Depo.    Patient reports to clinic for PE and to restart depo. Pt reports that she is in a weight loss clinic and gets lipo injections. States that she sees her BP is high here, and states that she goes 1x/month to the weight loss clinic and it is normal there. Pt has  a PCP.   Patient denies concerns about self today.    See flowsheet for other program required questions.   Body mass index is 48.87 kg/m. - Patient is eligible for diabetes screening based on BMI> 25 and age >35?  yes HA1C ordered? yes  Patient reports 1 of partners in last year. Desires STI screening?  Yes   Has patient been screened once for HCV in the past?  No  No results found for: "HCVAB"  Does the patient have current of drug use, have a partner with drug use, and/or has been incarcerated since last result? No  If yes-- Screen for HCV through [facility]   Does the patient meet criteria for HBV testing? No  Criteria:  -Household, sexual or needle sharing contact with HBV -History of drug use -HIV positive -Those with known Hep  C   Health Maintenance Due  Topic Date Due   COVID-19 Vaccine (1) Never done   Hepatitis C Screening  Never done   PAP SMEAR-Modifier  Never done    Review of Systems  Constitutional:  Positive for weight loss.  Eyes:  Negative for blurred vision.  Respiratory:  Negative for cough and shortness of breath.   Cardiovascular:  Negative for claudication.  Gastrointestinal:  Negative for nausea.  Genitourinary:  Negative for dysuria and frequency.  Skin:  Negative for rash.  Neurological:  Negative for headaches.  Endo/Heme/Allergies:  Does not bruise/bleed easily.    The following portions of the patient's history were reviewed and updated as appropriate: allergies, current medications, past family history, past medical history, past social history, past surgical history and problem list. Problem list updated.  Objective:   Vitals:   08/01/22 0841  BP: (!) 150/80  Pulse: 74  Temp: 97.9 F (36.6 C)  Weight: (!) 154.5 kg  Height: 5\' 10"  (1.778 m)    Physical Exam Vitals and nursing note reviewed.  Constitutional:      Appearance: Normal appearance.  HENT:     Head: Normocephalic and atraumatic.     Mouth/Throat:     Mouth: Mucous membranes are moist.     Dentition: Abnormal dentition. Dental caries present.     Pharynx: Oropharynx is clear. No oropharyngeal exudate or posterior oropharyngeal erythema.  Pulmonary:     Effort:  Pulmonary effort is normal.  Chest:  Breasts:    Tanner Score is 5.     Right: Normal. No mass, nipple discharge, skin change or tenderness.     Left: Normal. No mass, nipple discharge, skin change or tenderness.  Abdominal:     General: Abdomen is flat.     Palpations: There is no mass.     Tenderness: There is no abdominal tenderness. There is no rebound.  Genitourinary:    General: Normal vulva.     Exam position: Lithotomy position.     Pubic Area: No rash or pubic lice.      Tanner stage (genital): 5.     Labia:        Right: No rash  or lesion.        Left: No rash or lesion.      Vagina: Vaginal discharge present. No erythema, bleeding or lesions.     Cervix: Discharge present. No cervical motion tenderness, friability, lesion or erythema.     Uterus: Normal.      Adnexa: Right adnexa normal and left adnexa normal.     Rectum: Normal.     Comments: pH = 5  Thick adherent, copious amts of discharge present Lymphadenopathy:     Head:     Right side of head: No preauricular or posterior auricular adenopathy.     Left side of head: No preauricular or posterior auricular adenopathy.     Cervical: No cervical adenopathy.     Upper Body:     Right upper body: No supraclavicular, axillary or epitrochlear adenopathy.     Left upper body: No supraclavicular, axillary or epitrochlear adenopathy.     Lower Body: No right inguinal adenopathy. No left inguinal adenopathy.  Skin:    General: Skin is warm and dry.     Findings: No rash.  Neurological:     Mental Status: She is alert and oriented to person, place, and time.    Assessment and Plan:  Anita Hodge is a 37 y.o. female G2P2002 presenting to the [facility] Department for an yearly wellness and contraception visit   Contraception counseling: Reviewed options based on patient desire and reproductive life plan. Patient is interested in Hormonal Injection. This was provided to the patient today.   Risks, benefits, and typical effectiveness rates were reviewed.  Questions were answered.  Written information was also given to the patient to review.    The patient will follow up in  1 years for surveillance.  The patient was told to call with any further questions, or with any concerns about this method of contraception.  Emphasized use of condoms 100% of the time for STI prevention.  Patient was assessed for need for ECP. Not indicated- pts last unprotected sex was 1 week ago.   1. Well woman exam with routine gynecological exam -CBE today (normal), next  due in 2027, reviewed mammograms at 62 -unknown last pap test. Will do today- pt reports this was awhile ago -BP elevated today- pt states she has  a PCP, and is doing to a weight loss clinic in [place], down 10# so far. Has BP measured at each month, has no had elevated levels so far -recently had tooth pulled in ER, has dentist   2. Family planning -LMP 01/2022, if PT negative today, ok to give depo  - Pregnancy, urine - medroxyPROGESTERone (DEPO-PROVERA) injection 150 mg  3. Screening for venereal disease 1 week hx of thick discharge  -  Chlamydia/Gonorrhea [facility] - Hgb A1c w/o eAG - HIV [place] [facility] - Syphilis Serology, [address] [facility] - WET PREP FOR [place], YEAST, CLUE - Chlamydia/Gonorrhea [place]     Return in about 3 months (around 10/31/2022), or if symptoms worsen or fail to improve, for depo injection.  No future appointments.  Sharlet Salina, [place]

## 2022-08-02 LAB — HGB A1C W/O EAG: Hgb A1c MFr Bld: 5.8 % — ABNORMAL HIGH (ref 4.8–5.6)

## 2022-08-03 ENCOUNTER — Telehealth: Payer: Self-pay

## 2022-08-03 NOTE — Telephone Encounter (Signed)
RN contacted pt regarding her recent HA1C results. See telephone encounter.

## 2022-08-07 LAB — IGP, APTIMA HPV
HPV Aptima: NEGATIVE
PAP Smear Comment: 0

## 2022-08-09 ENCOUNTER — Encounter: Payer: Self-pay | Admitting: Family Medicine

## 2022-12-14 ENCOUNTER — Ambulatory Visit (LOCAL_COMMUNITY_HEALTH_CENTER): Payer: Medicaid Other | Admitting: Advanced Practice Midwife

## 2022-12-14 ENCOUNTER — Encounter: Payer: Self-pay | Admitting: Advanced Practice Midwife

## 2022-12-14 VITALS — BP 134/85 | HR 86 | Ht 70.0 in | Wt 334.0 lb

## 2022-12-14 DIAGNOSIS — Z309 Encounter for contraceptive management, unspecified: Secondary | ICD-10-CM

## 2022-12-14 DIAGNOSIS — Z3009 Encounter for other general counseling and advice on contraception: Secondary | ICD-10-CM

## 2022-12-14 DIAGNOSIS — Z30013 Encounter for initial prescription of injectable contraceptive: Secondary | ICD-10-CM | POA: Diagnosis not present

## 2022-12-14 DIAGNOSIS — Z3042 Encounter for surveillance of injectable contraceptive: Secondary | ICD-10-CM

## 2022-12-14 DIAGNOSIS — Z3202 Encounter for pregnancy test, result negative: Secondary | ICD-10-CM

## 2022-12-14 LAB — PREGNANCY, URINE: Preg Test, Ur: NEGATIVE

## 2022-12-14 MED ORDER — MEDROXYPROGESTERONE ACETATE 150 MG/ML IM SUSP
150.0000 mg | Freq: Once | INTRAMUSCULAR | Status: AC
  Administered 2022-12-14: 150 mg via INTRAMUSCULAR

## 2022-12-14 NOTE — Progress Notes (Signed)
PT here for acute visit.  Depo given in RUOQ.  Reminder card given, next injection due 10/31. Condoms declined. Gaspar Garbe, RN

## 2022-12-14 NOTE — Progress Notes (Signed)
[facility] [address] Main Number: [phone]  Contraception/Family Planning VISIT ENCOUNTER NOTE  Subjective:   Anita Hodge is a 37 y.o. SBF nonsmoker G36P2002 female here for reproductive life counseling. The patient is currently using Female Condom to prevent pregnancy.   Pt had new start of DMPA on 08/01/22 with PE and wants more DMPA (19 2/7 wks). Last sex 12/11/22. LMP 11/28/22. Denies cigs, vaping, cigars. Last pap 08/01/22 neg HPV neg  The patient does not want a pregnancy in the next year.    Client states they are looking for the following:  High efficacy at preventing pregnancy  Denies abnormal vaginal bleeding, discharge, pelvic pain, problems with intercourse or other gynecologic concerns.    Gynecologic History Patient's last menstrual period was 11/28/2022.  Health Maintenance Due  Topic Date Due   Hepatitis C Screening  Never done   COVID-19 Vaccine (1 - 2023-24 season) Never done   INFLUENZA VACCINE  11/30/2022     The following portions of the patient's history were reviewed and updated as appropriate: allergies, current medications, past family history, past medical history, past social history, past surgical history and problem list.  Review of Systems Pertinent items are noted in HPI.   Objective:  BP 134/85 (Patient Position: Sitting)   Pulse 86   Ht 5\' 10"  (1.778 m)   Wt (!) 334 lb (151.5 kg)   LMP 11/28/2022   BMI 47.92 kg/m  Gen: well appearing, NAD HEENT: no scleral icterus CV: RR Lung: Normal WOB Ext: warm well perfused    Assessment and Plan:   Need for emergency contraceptive care was assessed today.  Last unprotected sex was:  12/11/22 with condom; with current partner x 4 years; 1 partner in last 3 mo.  Patient reported > 120 hours .  Reviewed options and patient desired No method of ECP, declined all    Contraception counseling: Reviewed methods in a patient centered  fashion and used shared decision making with the patient. Utilized [employer] patient education tools as appropriate. The patient stated there goals and desires from a method are: High efficacy at preventing pregnancy  We reviewed the following methods in detail based on patient preferences available included: Female Condom  Patient expressed they would like Hormonal Injection  This was provided to the patient today.  if not why not clearly documented  Risks, benefits, and typical effectiveness rates were reviewed.  Questions were answered.  Written information was also given to the patient to review.       will follow up in  12-13 weeks for surveillance.   was told to call with any further questions, or with any concerns about this method of contraception or cycle control.  Emphasized use of condoms 100% of the time for STI prevention.   1. Family planning Pt states she went to PCP after 08/01/22 apt for BP evaluation and was told it was ok  - Pregnancy, urine  2. Encounter for surveillance of injectable contraceptive May have DMPA 150 mg IM today if PT neg today Pt counseled abstinance  next 7 days  - medroxyPROGESTERone (DEPO-PROVERA) injection 150 mg    Please refer to After Visit Summary for other counseling recommendations.   No follow-ups on file.  Alberteen Spindle, CNM [facility] DEPARTMENT

## 2023-03-01 ENCOUNTER — Ambulatory Visit: Payer: Medicaid Other

## 2023-03-07 ENCOUNTER — Ambulatory Visit: Payer: Medicaid Other

## 2023-03-07 VITALS — BP 140/79 | Ht 70.0 in | Wt 330.0 lb

## 2023-03-07 DIAGNOSIS — Z3042 Encounter for surveillance of injectable contraceptive: Secondary | ICD-10-CM | POA: Diagnosis not present

## 2023-03-07 DIAGNOSIS — Z309 Encounter for contraceptive management, unspecified: Secondary | ICD-10-CM | POA: Diagnosis not present

## 2023-03-07 DIAGNOSIS — Z3009 Encounter for other general counseling and advice on contraception: Secondary | ICD-10-CM

## 2023-03-07 NOTE — Progress Notes (Signed)
11 weeks 6 days post depo. Voices no concerns. Depo given today per order by Aliene Altes, FNP dated 08/01/22. Tolerated well LUOQ. Next depo due 05/22/2022, has reminder. Jerel Shepherd, RN

## 2023-11-16 IMAGING — CT CT HEAD CODE STROKE
4 series · 16 of 47 positions shown, 18 images · non-contrast
Comparison: None.

CLINICAL DATA: Code stroke. Acute neurologic deficit. Sudden onset
headache and right arm numbness.



[Series 3: head wo · axial · 0.47mm/px · z∈[+344,+469]mm · 7 of 35 slices shown, 9 images]
[im 5/35  brain]
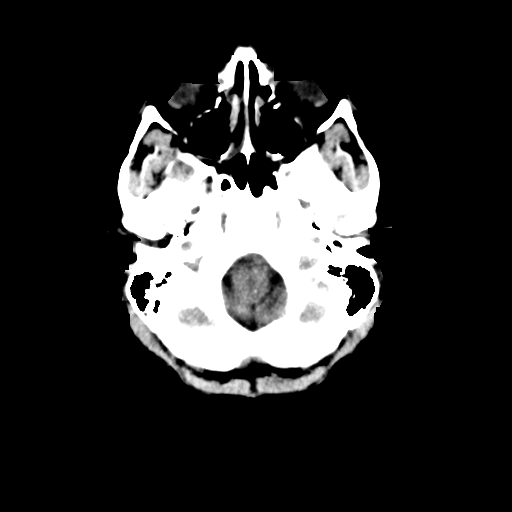
[im 5/35  bone]
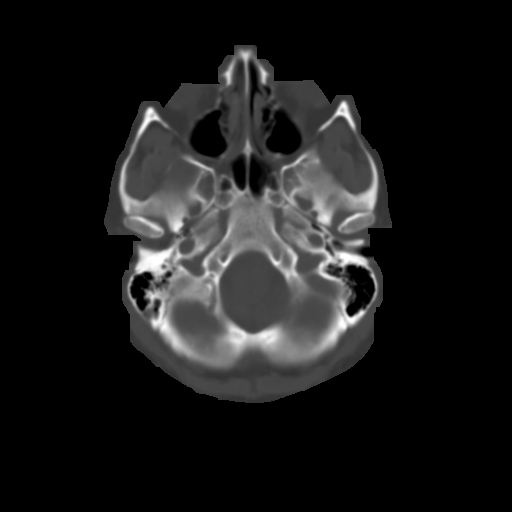
[im 9/35  brain]
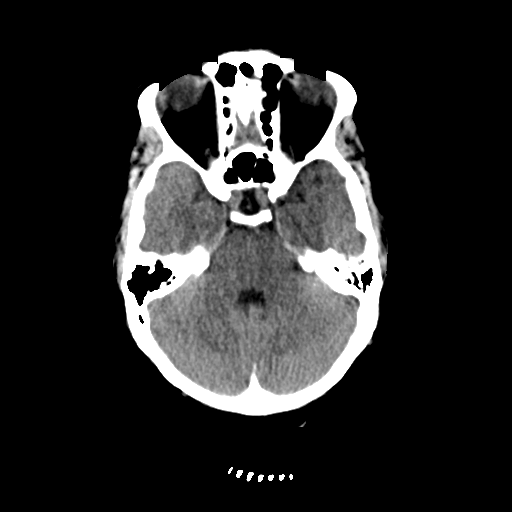
[im 13/35  brain]
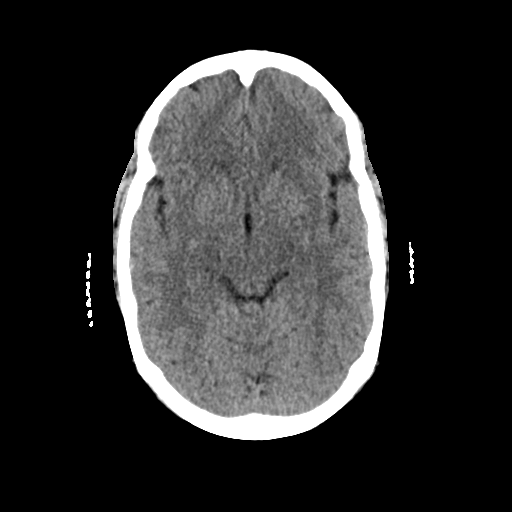
[im 18/35  brain]
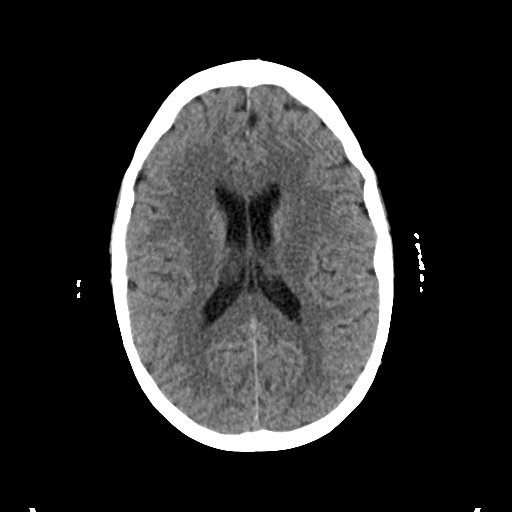
[im 22/35  brain]
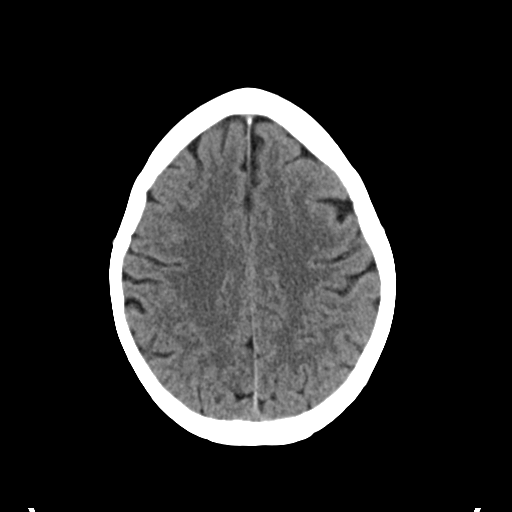
[im 22/35  bone]
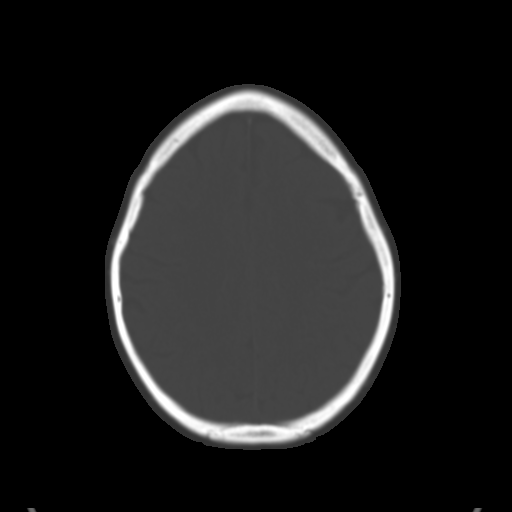
[im 26/35  brain]
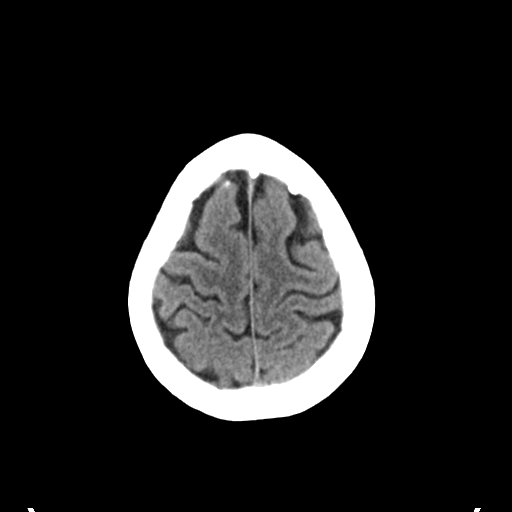
[im 30/35  brain]
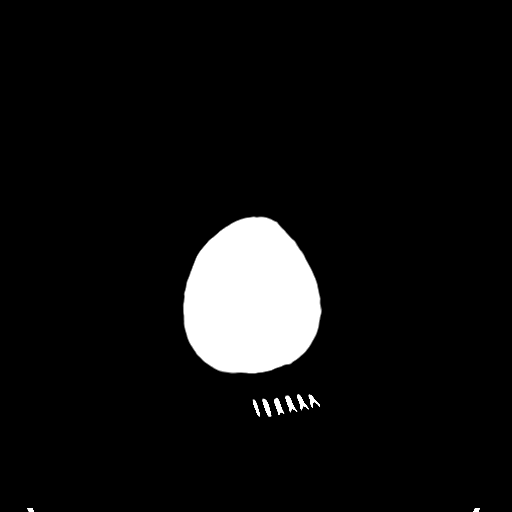

[Series 4: head bone · axial · 0.47mm/px · z∈[+340,+374]mm · 3 of 86 slices shown]
[im 9/86  bone]
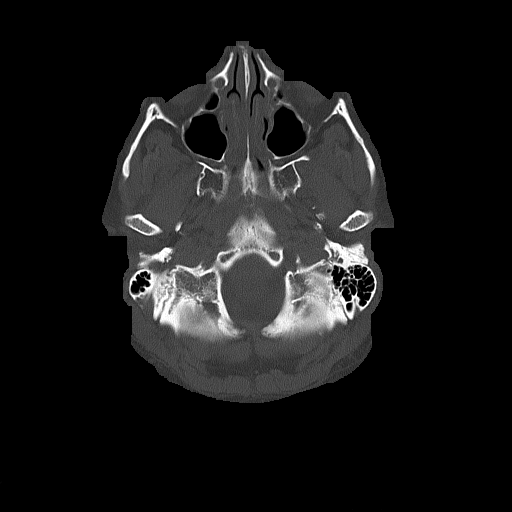
[im 18/86  bone]
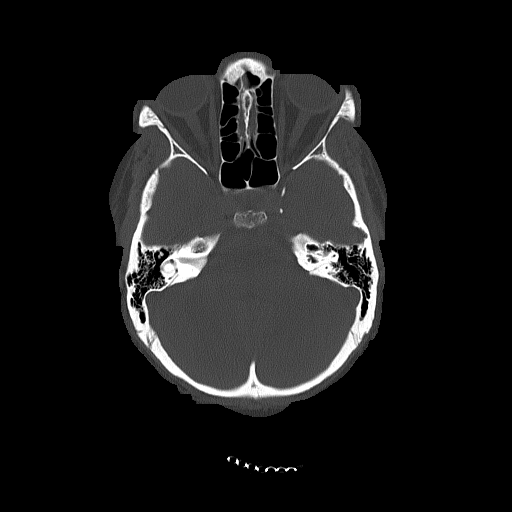
[im 26/86  bone]
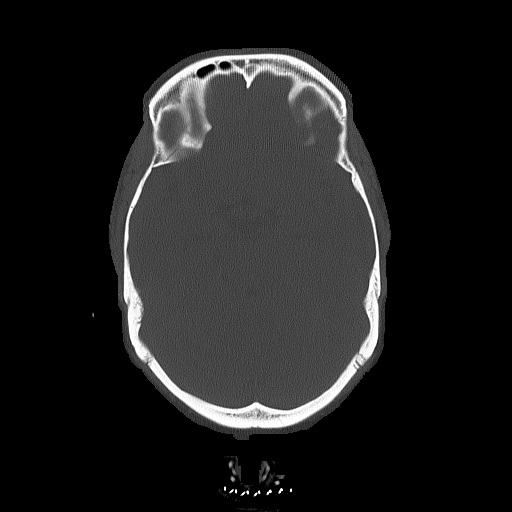

[Series 5: coronal soft tissue · coronal · 0.30mm/px · 3 of 66 slices shown]
[im 22/66  brain]
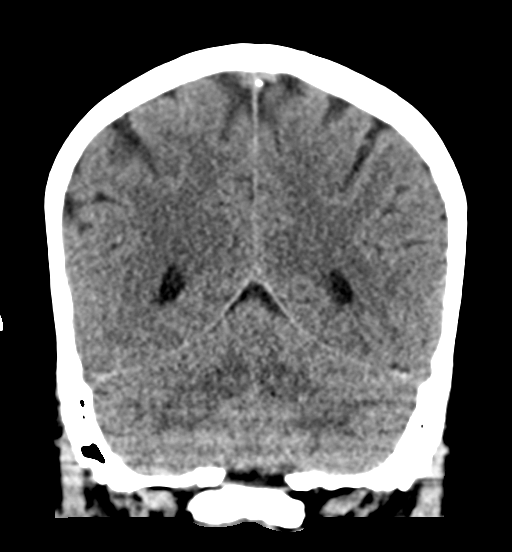
[im 29/66  brain]
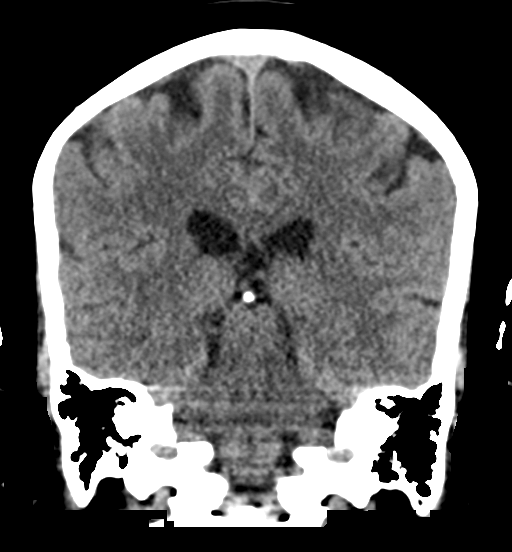
[im 37/66  brain]
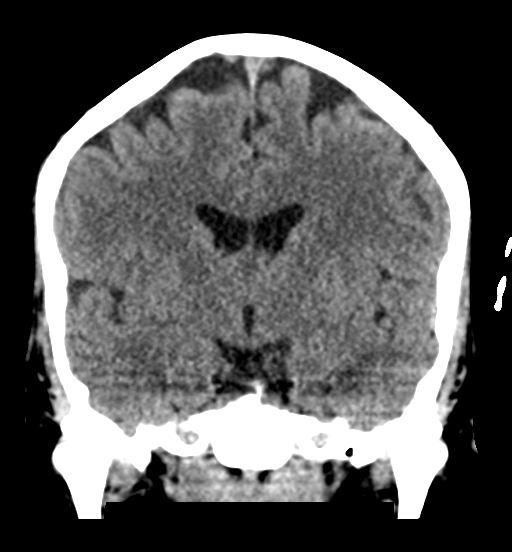

[Series 6: sagittal soft tissue · sagittal · 0.33mm/px · 3 of 52 slices shown]
[im 18/52  brain]
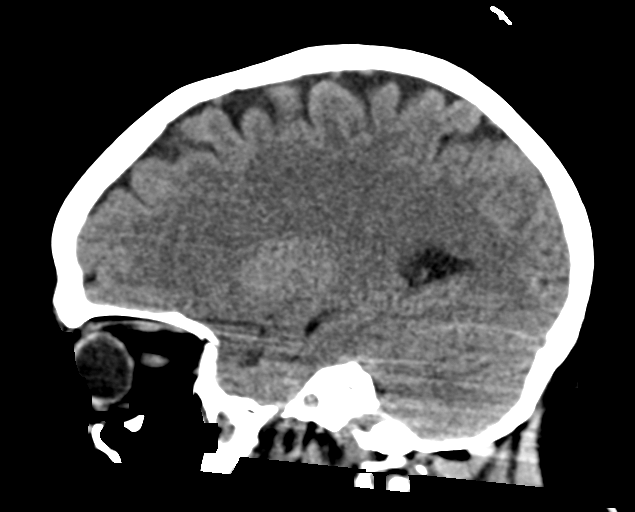
[im 26/52  brain]
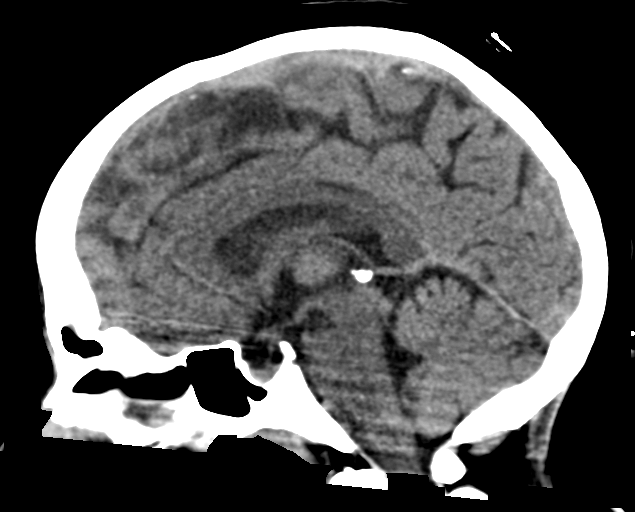
[im 35/52  brain]
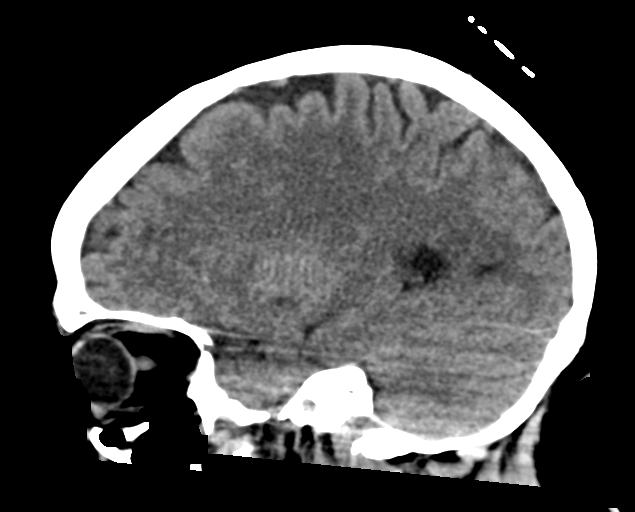

[16 of 47 positions shown; findings below may reference images not displayed]

FINDINGS: Brain: There is no mass, hemorrhage or extra-axial collection. The
size and configuration of the ventricles and extra-axial CSF spaces
are normal. The brain parenchyma is normal, without evidence of
acute or chronic infarction.

Vascular: No abnormal hyperdensity of the major intracranial
arteries or dural venous sinuses. No intracranial atherosclerosis.

Skull: The visualized skull base, calvarium and extracranial soft
tissues are normal.

Sinuses/Orbits: No fluid levels or advanced mucosal thickening of
the visualized paranasal sinuses. No mastoid or middle ear effusion.
The orbits are normal.

ASPECTS (Alberta Stroke Program Early CT Score)

- Ganglionic level infarction (caudate, lentiform nuclei, internal
capsule, insula, M1-M3 cortex): 7

- Supraganglionic infarction (M4-M6 cortex): 3

Total score (0-10 with 10 being normal): 10
IMPRESSION: 1. Normal head CT.
2. ASPECTS is 10.

These results were called by telephone at the time of interpretation
on 06/14/2021 at [DATE] to provider GENNA KARGBO , who verbally
acknowledged these results.
